# Patient Record
Sex: Male | Born: 1975 | Race: Black or African American | Hispanic: No | Marital: Married | Smoking: Former smoker
Health system: Southern US, Community
[De-identification: ages and names within clinical notes are randomized; demographics above are authoritative.]

## PROBLEM LIST (undated history)

## (undated) DIAGNOSIS — H409 Unspecified glaucoma: Secondary | ICD-10-CM

## (undated) DIAGNOSIS — L309 Dermatitis, unspecified: Secondary | ICD-10-CM

## (undated) HISTORY — DX: Unspecified glaucoma: H40.9

## (undated) HISTORY — PX: ANKLE SURGERY: SHX546

---

## 2005-09-20 ENCOUNTER — Emergency Department (HOSPITAL_COMMUNITY): Admission: EM | Admit: 2005-09-20 | Discharge: 2005-09-20 | Payer: Self-pay | Admitting: Emergency Medicine

## 2006-03-11 ENCOUNTER — Emergency Department (HOSPITAL_COMMUNITY): Admission: EM | Admit: 2006-03-11 | Discharge: 2006-03-11 | Payer: Self-pay | Admitting: Emergency Medicine

## 2007-01-09 ENCOUNTER — Emergency Department (HOSPITAL_COMMUNITY): Admission: EM | Admit: 2007-01-09 | Discharge: 2007-01-09 | Payer: Self-pay | Admitting: Emergency Medicine

## 2007-06-25 ENCOUNTER — Emergency Department (HOSPITAL_COMMUNITY): Admission: EM | Admit: 2007-06-25 | Discharge: 2007-06-25 | Payer: Self-pay | Admitting: Emergency Medicine

## 2007-09-11 ENCOUNTER — Encounter: Payer: Self-pay | Admitting: Internal Medicine

## 2007-10-10 ENCOUNTER — Emergency Department (HOSPITAL_COMMUNITY): Admission: EM | Admit: 2007-10-10 | Discharge: 2007-10-10 | Payer: Self-pay | Admitting: Emergency Medicine

## 2007-12-23 ENCOUNTER — Emergency Department (HOSPITAL_COMMUNITY): Admission: EM | Admit: 2007-12-23 | Discharge: 2007-12-23 | Payer: Self-pay | Admitting: Emergency Medicine

## 2009-09-20 ENCOUNTER — Emergency Department (HOSPITAL_COMMUNITY): Admission: EM | Admit: 2009-09-20 | Discharge: 2009-09-20 | Payer: Self-pay | Admitting: Emergency Medicine

## 2009-10-04 ENCOUNTER — Emergency Department (HOSPITAL_COMMUNITY)
Admission: EM | Admit: 2009-10-04 | Discharge: 2009-10-04 | Payer: Self-pay | Source: Home / Self Care | Admitting: Emergency Medicine

## 2010-06-27 ENCOUNTER — Emergency Department (HOSPITAL_COMMUNITY)
Admission: EM | Admit: 2010-06-27 | Discharge: 2010-06-27 | Payer: Self-pay | Source: Home / Self Care | Admitting: Emergency Medicine

## 2011-09-02 ENCOUNTER — Emergency Department (HOSPITAL_COMMUNITY): Payer: Self-pay

## 2011-09-02 ENCOUNTER — Emergency Department (HOSPITAL_COMMUNITY)
Admission: EM | Admit: 2011-09-02 | Discharge: 2011-09-03 | Disposition: A | Discharge status: Home / Self Care | Payer: Self-pay | Attending: Emergency Medicine | Admitting: Emergency Medicine

## 2011-09-02 ENCOUNTER — Other Ambulatory Visit: Payer: Self-pay

## 2011-09-02 ENCOUNTER — Encounter (HOSPITAL_COMMUNITY): Payer: Self-pay | Admitting: Emergency Medicine

## 2011-09-02 DIAGNOSIS — R05 Cough: Secondary | ICD-10-CM | POA: Insufficient documentation

## 2011-09-02 DIAGNOSIS — J3489 Other specified disorders of nose and nasal sinuses: Secondary | ICD-10-CM | POA: Insufficient documentation

## 2011-09-02 DIAGNOSIS — R059 Cough, unspecified: Secondary | ICD-10-CM | POA: Insufficient documentation

## 2011-09-02 DIAGNOSIS — R079 Chest pain, unspecified: Secondary | ICD-10-CM | POA: Insufficient documentation

## 2011-09-02 DIAGNOSIS — G43909 Migraine, unspecified, not intractable, without status migrainosus: Secondary | ICD-10-CM | POA: Insufficient documentation

## 2011-09-02 DIAGNOSIS — J069 Acute upper respiratory infection, unspecified: Secondary | ICD-10-CM | POA: Insufficient documentation

## 2011-09-02 MED ORDER — IBUPROFEN 800 MG PO TABS
800.0000 mg | ORAL_TABLET | Freq: Once | ORAL | Status: AC
  Administered 2011-09-02: 800 mg via ORAL
  Filled 2011-09-02: qty 1

## 2011-09-02 MED ORDER — SODIUM CHLORIDE 0.9 % IV BOLUS (SEPSIS): 1000.0000 mL | Freq: Once | INTRAVENOUS | Status: DC

## 2011-09-02 MED ORDER — METOCLOPRAMIDE HCL 5 MG/ML IJ SOLN: 10.0000 mg | Freq: Once | INTRAMUSCULAR | Status: DC

## 2011-09-02 MED ORDER — OXYMETAZOLINE HCL 0.05 % NA SOLN
2.0000 | Freq: Once | NASAL | Status: AC
  Administered 2011-09-02: 2 via NASAL
  Filled 2011-09-02: qty 15

## 2011-09-02 MED ORDER — DIPHENHYDRAMINE HCL 50 MG/ML IJ SOLN: 25.0000 mg | Freq: Once | INTRAMUSCULAR | Status: DC

## 2011-09-02 NOTE — ED Notes (Signed)
Pt c/o chest pain that began earlier today and describes the pain as tight with pressure.  Pt denies SOB and states pain does not radiate anywhere else. Pt denies hx of similar symptoms. Pt also c/o headache.

## 2011-09-03 MED ORDER — DIPHENHYDRAMINE HCL 50 MG/ML IJ SOLN
25.0000 mg | Freq: Once | INTRAMUSCULAR | Status: AC
  Administered 2011-09-03: 25 mg via INTRAVENOUS
  Filled 2011-09-03: qty 1

## 2011-09-03 MED ORDER — SODIUM CHLORIDE 0.9 % IV BOLUS (SEPSIS)
1000.0000 mL | Freq: Once | INTRAVENOUS | Status: AC
  Administered 2011-09-03: 1000 mL via INTRAVENOUS

## 2011-09-03 MED ORDER — DEXAMETHASONE SODIUM PHOSPHATE 10 MG/ML IJ SOLN
10.0000 mg | Freq: Once | INTRAMUSCULAR | Status: AC
  Administered 2011-09-03: 10 mg via INTRAVENOUS
  Filled 2011-09-03: qty 1

## 2011-09-03 MED ORDER — METOCLOPRAMIDE HCL 5 MG/ML IJ SOLN
10.0000 mg | Freq: Once | INTRAMUSCULAR | Status: AC
  Administered 2011-09-03: 10 mg via INTRAVENOUS
  Filled 2011-09-03: qty 2

## 2011-09-03 NOTE — Discharge Instructions (Signed)
Headache and Allergies The relationship between allergies and headaches is unclear. Many people with allergic or infectious nasal problems also have headaches (migraines or sinus headaches). However, sometimes allergies can cause pressure that feels like a headache, and sometimes headaches can cause allergy-like symptoms. It is not always clear whether your symptoms are caused by allergies or by a headache. CAUSES   Migraine: The cause of a migraine is not always known.   Sinus Headache: The cause of a sinus headache may be a sinus infection. Other conditions that may be related to sinus headaches include:   Hay fever (allergic rhinitis).   Deviation of the nasal septum.   Swelling or clogging of the nasal passages.  SYMPTOMS  Migraine headache symptoms (which often last 4 to 72 hours) include:  Intense, throbbing pain on one or both sides of the head.   Nausea.   Vomiting.   Being extra sensitive to light.   Being extra sensitive to sound.   Nervous system reactions that appear similar to an allergic reaction:   Stuffy nose.   Runny nose.   Tearing.  Sinus headaches are felt as facial pain or pressure.  DIAGNOSIS  Because there is some overlap in symptoms, sinus and migraine headaches are often misdiagnosed. For example, a person with migraines may also feel facial pressure. Likewise, many people with hay fever may get migraine headaches rather than sinus headaches. These migraines can be triggered by the histamine release during an allergic reaction. An antihistamine medicine can eliminate this pain. There are standard criteria that help clarify the difference between these headaches and related allergy or allergy-like symptoms. Your caregiver can use these criteria to determine the proper diagnosis and provide you the best care. TREATMENT  Migraine medicine may help people who have persistent migraine headaches even though their hay fever is controlled. For some people,  anti-inflammatory treatments do not work to relieve migraines. Medicines called triptans (such as sumatriptan) can be helpful for those people. Document Released: 08/25/2003 Document Revised: 05/24/2011 Document Reviewed: 09/16/2009 [facility] Patient Information 2012 [place], [place].Migraine Headache A migraine headache is an intense, throbbing pain on one or both sides of your head. The exact cause of a migraine headache is not always known. A migraine may be caused when nerves in the brain become irritated and release chemicals that cause swelling within blood vessels, causing pain. Many migraine sufferers have a family history of migraines. Before you get a migraine you may or may not get an aura. An aura is a group of symptoms that can predict the beginning of a migraine. An aura may include:  Visual changes such as:   Flashing lights.   Bright spots or zig-zag lines.   Tunnel vision.   Feelings of numbness.   Trouble talking.   Muscle weakness.  SYMPTOMS  Pain on one or both sides of your head.   Pain that is pulsating or throbbing in nature.   Pain that is severe enough to prevent daily activities.   Pain that is aggravated by any daily physical activity.   Nausea (feeling sick to your stomach), vomiting, or both.   Pain with exposure to bright lights, loud noises, or activity.   General sensitivity to bright lights or loud noises.  MIGRAINE TRIGGERS Examples of triggers of migraine headaches include:   Alcohol.   Smoking.   Stress.   It may be related to menses (male menstruation).   Aged cheeses.   Foods or drinks that contain nitrates, glutamate, aspartame, or  tyramine.   Lack of sleep.   Chocolate.   Caffeine.   Hunger.   Medications such as nitroglycerine (used to treat chest pain), birth control pills, estrogen, and some blood pressure medications.  DIAGNOSIS  A migraine headache is often diagnosed based on:  Symptoms.   Physical examination.    A computerized X-ray scan (computed tomography, CT) of your head.  TREATMENT  Medications can help prevent migraines if they are recurrent or should they become recurrent. Your caregiver can help you with a medication or treatment program that will be helpful to you.   Lying down in a dark, quiet room may be helpful.   Keeping a headache diary may help you find a trend as to what may be triggering your headaches.  SEEK IMMEDIATE MEDICAL CARE IF:   You have confusion, personality changes or seizures.   You have headaches that wake you from sleep.   You have an increased frequency in your headaches.   You have a stiff neck.   You have a loss of vision.   You have muscle weakness.   You start losing your balance or have trouble walking.   You feel faint or pass out.  MAKE SURE YOU:   Understand these instructions.   Will watch your condition.   Will get help right away if you are not doing well or get worse.  Document Released: 06/04/2005 Document Revised: 05/24/2011 Document Reviewed: 01/18/2009 [facility] Patient Information 2012 [place], [place].Upper Respiratory Infection, Adult An upper respiratory infection (URI) is also sometimes known as the common cold. The upper respiratory tract includes the nose, sinuses, throat, trachea, and bronchi. Bronchi are the airways leading to the lungs. Most people improve within 1 week, but symptoms can last up to 2 weeks. A residual cough may last even longer.  CAUSES Many different viruses can infect the tissues lining the upper respiratory tract. The tissues become irritated and inflamed and often become very moist. Mucus production is also common. A cold is contagious. You can easily spread the virus to others by oral contact. This includes kissing, sharing a glass, coughing, or sneezing. Touching your mouth or nose and then touching a surface, which is then touched by another person, can also spread the virus. SYMPTOMS  Symptoms  typically develop 1 to 3 days after you come in contact with a cold virus. Symptoms vary from person to person. They may include:  Runny nose.   Sneezing.   Nasal congestion.   Sinus irritation.   Sore throat.   Loss of voice (laryngitis).   Cough.   Fatigue.   Muscle aches.   Loss of appetite.   Headache.   Low-grade fever.  DIAGNOSIS  You might diagnose your own cold based on familiar symptoms, since most people get a cold 2 to 3 times a year. Your caregiver can confirm this based on your exam. Most importantly, your caregiver can check that your symptoms are not due to another disease such as strep throat, sinusitis, pneumonia, asthma, or epiglottitis. Blood tests, throat tests, and X-rays are not necessary to diagnose a common cold, but they may sometimes be helpful in excluding other more serious diseases. Your caregiver will decide if any further tests are required. RISKS AND COMPLICATIONS  You may be at risk for a more severe case of the common cold if you smoke cigarettes, have chronic heart disease (such as heart failure) or lung disease (such as asthma), or if you have a weakened immune system. The very  young and very old are also at risk for more serious infections. Bacterial sinusitis, middle ear infections, and bacterial pneumonia can complicate the common cold. The common cold can worsen asthma and chronic obstructive pulmonary disease (COPD). Sometimes, these complications can require emergency medical care and may be life-threatening. PREVENTION  The best way to protect against getting a cold is to practice good hygiene. Avoid oral or hand contact with people with cold symptoms. Wash your hands often if contact occurs. There is no clear evidence that vitamin C, vitamin E, echinacea, or exercise reduces the chance of developing a cold. However, it is always recommended to get plenty of rest and practice good nutrition. TREATMENT  Treatment is directed at relieving  symptoms. There is no cure. Antibiotics are not effective, because the infection is caused by a virus, not by bacteria. Treatment may include:  Increased fluid intake. Sports drinks offer valuable electrolytes, sugars, and fluids.   Breathing heated mist or steam (vaporizer or shower).   Eating chicken soup or other clear broths, and maintaining good nutrition.   Getting plenty of rest.   Using gargles or lozenges for comfort.   Controlling fevers with ibuprofen or acetaminophen as directed by your caregiver.   Increasing usage of your inhaler if you have asthma.  Zinc gel and zinc lozenges, taken in the first 24 hours of the common cold, can shorten the duration and lessen the severity of symptoms. Pain medicines may help with fever, muscle aches, and throat pain. A variety of non-prescription medicines are available to treat congestion and runny nose. Your caregiver can make recommendations and may suggest nasal or lung inhalers for other symptoms.  HOME CARE INSTRUCTIONS   Only take over-the-counter or prescription medicines for pain, discomfort, or fever as directed by your caregiver.   Use a warm mist humidifier or inhale steam from a shower to increase air moisture. This may keep secretions moist and make it easier to breathe.   Drink enough water and fluids to keep your urine clear or pale yellow.   Rest as needed.   Return to work when your temperature has returned to normal or as your caregiver advises. You may need to stay home longer to avoid infecting others. You can also use a face mask and careful hand washing to prevent spread of the virus.  SEEK MEDICAL CARE IF:   After the first few days, you feel you are getting worse rather than better.   You need your caregiver's advice about medicines to control symptoms.   You develop chills, worsening shortness of breath, or brown or red sputum. These may be signs of pneumonia.   You develop yellow or brown nasal discharge or  pain in the face, especially when you bend forward. These may be signs of sinusitis.   You develop a fever, swollen neck glands, pain with swallowing, or white areas in the back of your throat. These may be signs of strep throat.  SEEK IMMEDIATE MEDICAL CARE IF:   You have a fever.   You develop severe or persistent headache, ear pain, sinus pain, or chest pain.   You develop wheezing, a prolonged cough, cough up blood, or have a change in your usual mucus (if you have chronic lung disease).   You develop sore muscles or a stiff neck.  Document Released: 11/28/2000 Document Revised: 05/24/2011 Document Reviewed: 10/06/2010 [facility] Patient Information 2012 [place], [place].

## 2011-09-03 NOTE — ED Provider Notes (Signed)
History     CSN: [id number]  Arrival date & time 09/02/11  2130   First MD Initiated Contact with Patient 09/02/11 2258      Chief Complaint  Patient presents with  . Chest Pain    (Consider location/radiation/quality/duration/timing/severity/associated sxs/prior treatment) HPI Patient is a 36 yo M who presents complaining of 8/10 chest pain as well as upper airway congestion and cough.  He has no radiation of his pain and reports it is worse with coughing.  He also complains of 10/10 pain with a headache.  Patient has no fevers, nuchal rigidity, neck pain, neurologic symptoms or rahs.  He has no early family history of CAD or personal or family history of PE or DVT.  Patient does not had HTN, HLD, or DM.  He has had no improvement with OTC meds.  There are no other associated or modifying factors.  History reviewed. No pertinent past medical history.  History reviewed. No pertinent past surgical history.  History reviewed. No pertinent family history.  History  Substance Use Topics  . Smoking status: Not on file  . Smokeless tobacco: Not on file  . Alcohol Use: Not on file      Review of Systems  Constitutional: Positive for fatigue.  HENT: Positive for congestion.   Eyes: Positive for photophobia.  Respiratory: Positive for cough.   Cardiovascular: Positive for chest pain.  Gastrointestinal: Negative.   Genitourinary: Negative.   Musculoskeletal: Negative.   Skin: Negative.   Neurological: Positive for headaches.  Hematological: Negative.   Psychiatric/Behavioral: Negative.   All other systems reviewed and are negative.    Allergies  Review of patient's allergies indicates no known allergies.  Home Medications   Current Outpatient Rx  Name Route Sig Dispense Refill  . CETIRIZINE HCL 10 MG PO TABS Oral Take 10 mg by mouth daily.    . TETRAHYDROZOLINE HCL 0.05 % OP SOLN Both Eyes Place 1 drop into both eyes 4 (four) times daily as needed. For dry/irritated  eyes      BP 142/82  Pulse 64  Resp 16  SpO2 100%  Physical Exam  Nursing note and vitals reviewed. GEN: Well-developed, well-nourished male in no distress HEENT: Atraumatic, normocephalic. Oropharynx clear without erythema EYES: PERRLA BL, no scleral icterus. NECK: Trachea midline, no meningismus CV: regular rate and rhythm. No murmurs, rubs, or gallops PULM: No respiratory distress.  No crackles, wheezes, or rales. GI: soft, non-tender. No guarding, rebound, or tenderness. + bowel sounds  GU: deferred Neuro: cranial nerves 2-12 intact, no abnormalities of strength or sensation, A and O x 3 MSK: Patient moves all 4 extremities symmetrically, no deformity, edema, or injury noted Skin: No rashes petechiae, purpura, or jaundice Psych: no abnormality of mood   ED Course  Procedures (including critical care time)   Date: 09/03/2011  Rate: 66  Rhythm: normal sinus rhythm  QRS Axis: normal  Intervals: normal  ST/T Wave abnormalities: normal  Conduction Disutrbances: none  Narrative Interpretation: unremarkable    Labs Reviewed - No data to display Dg Chest 2 View  09/02/2011  *RADIOLOGY REPORT*  Clinical Data: Chest pain for 24 hours  CHEST - 2 VIEW  Comparison: 06/25/2007  Findings: The heart size and mediastinal contours are within normal limits.  Both lungs are clear.  The visualized skeletal structures are unremarkable.  IMPRESSION:  Negative exam.  Original Report Authenticated By: Rosealee Albee, M.D.     1. Acute URI   2. Migraine  MDM  Patient had CXR and EKG that were unremarkable and given history required no further work-up for chest pain.  His headache was treated initially with Afrin for congestion with minimal improvement.  He was then given reglan and benadryl with complete resolution of symptoms.  Patient was given decadron and discharged in good condition.         Cyndra Numbers, MD 09/05/11 [phone]

## 2012-02-13 ENCOUNTER — Emergency Department (HOSPITAL_COMMUNITY)
Admission: EM | Admit: 2012-02-13 | Discharge: 2012-02-13 | Disposition: A | Discharge status: Home / Self Care | Payer: Self-pay | Attending: Emergency Medicine | Admitting: Emergency Medicine

## 2012-02-13 ENCOUNTER — Emergency Department (HOSPITAL_COMMUNITY): Payer: Self-pay

## 2012-02-13 ENCOUNTER — Encounter (HOSPITAL_COMMUNITY): Payer: Self-pay | Admitting: Emergency Medicine

## 2012-02-13 DIAGNOSIS — R05 Cough: Secondary | ICD-10-CM

## 2012-02-13 DIAGNOSIS — F17200 Nicotine dependence, unspecified, uncomplicated: Secondary | ICD-10-CM | POA: Insufficient documentation

## 2012-02-13 DIAGNOSIS — J069 Acute upper respiratory infection, unspecified: Secondary | ICD-10-CM | POA: Insufficient documentation

## 2012-02-13 LAB — CBC
Hemoglobin: 14.2 g/dL (ref 13.0–17.0)
MCH: 28.7 pg (ref 26.0–34.0)
RBC: 4.95 MIL/uL (ref 4.22–5.81)

## 2012-02-13 NOTE — ED Notes (Signed)
Pt states he started coughing up blood on Tuesday and has had some dizziness and a headache pressure  Pt states every now and then he will have pain in his chest

## 2012-02-13 NOTE — ED Provider Notes (Signed)
History     CSN: [id number]  Arrival date & time 02/13/12  0034   First MD Initiated Contact with Patient 02/13/12 0143      Chief Complaint  Patient presents with  . Hemoptysis    (Consider location/radiation/quality/duration/timing/severity/associated sxs/prior treatment) HPI Comments: Patient states, that it for the past 2, days.  He's had some nasal congestion, and pressure in his frontal area.  It is worse when he bends over.  He occasionally feels dizzy.  Tonight at work.  He had a coughing episode, and saw one episode of blood-tinged mucus on the tissue.  He was sent for further evaluation by his employer.  He, states he's never had any episodes like this before.  He is a smoker.  He denies sore throat, or any over-the-counter medication use  The history is provided by the patient.    History reviewed. No pertinent past medical history.  History reviewed. No pertinent past surgical history.  Family History  Problem Relation Age of Onset  . Diabetes Father   . Hypertension Other     History  Substance Use Topics  . Smoking status: Current Everyday Smoker    Types: Cigarettes  . Smokeless tobacco: Not on file  . Alcohol Use: No      Review of Systems  Constitutional: Negative for fever and chills.  HENT: Positive for congestion. Negative for sore throat, rhinorrhea and ear discharge.   Respiratory: Positive for cough. Negative for shortness of breath and wheezing.   Gastrointestinal: Negative for nausea and vomiting.  Musculoskeletal: Negative for myalgias.  Skin: Negative for rash and wound.  Neurological: Positive for dizziness. Negative for headaches.    Allergies  Review of patient's allergies indicates no known allergies.  Home Medications  No current outpatient prescriptions on file.  BP 134/68  Pulse 65  Temp 97.8 F (36.6 C) (Oral)  Resp 24  SpO2 100%  Physical Exam  Constitutional: He is oriented to person, place, and time. He appears  well-nourished.  HENT:  Head: Normocephalic and atraumatic.  Mouth/Throat: Uvula is midline and mucous membranes are normal. No oropharyngeal exudate, posterior oropharyngeal edema or posterior oropharyngeal erythema.  Neck: Normal range of motion.  Cardiovascular: Normal rate.   Pulmonary/Chest: Effort normal and breath sounds normal. No respiratory distress. He has no wheezes. He has no rales. He exhibits no tenderness.  Abdominal: Soft. He exhibits no distension.  Musculoskeletal: Normal range of motion.  Neurological: He is alert and oriented to person, place, and time.  Skin: Skin is warm.    ED Course  Procedures (including critical care time)   Labs Reviewed  CBC   Dg Chest 2 View  02/13/2012  *RADIOLOGY REPORT*  Clinical Data: Hemoptysis  CHEST - 2 VIEW  Comparison: 09/02/2011  Findings: Lungs are clear. No pleural effusion or pneumothorax. The cardiomediastinal contours are within normal limits. The visualized bones and soft tissues are without significant appreciable abnormality.  IMPRESSION: No radiographic evidence of acute cardiopulmonary process.   Original Report Authenticated By: Waneta Martins, M.D.      1. URI (upper respiratory infection)   2. Cough       MDM   Chest x-ray, reviewed and is, normal.  We'll obtain CBC, mostly for patient's reassurance        Arman Filter, NP 02/13/12 [phone]

## 2012-02-13 NOTE — ED Provider Notes (Signed)
Medical screening examination/treatment/procedure(s) were performed by non-physician practitioner and as supervising physician I was immediately available for consultation/collaboration.   Lyanne Co, MD 02/13/12 [phone]

## 2012-09-17 ENCOUNTER — Emergency Department (HOSPITAL_COMMUNITY): Payer: BC Managed Care – PPO

## 2012-09-17 ENCOUNTER — Encounter (HOSPITAL_COMMUNITY): Payer: Self-pay | Admitting: Emergency Medicine

## 2012-09-17 ENCOUNTER — Emergency Department (HOSPITAL_COMMUNITY)
Admission: EM | Admit: 2012-09-17 | Discharge: 2012-09-18 | Disposition: A | Discharge status: Home / Self Care | Payer: BC Managed Care – PPO | Attending: Emergency Medicine | Admitting: Emergency Medicine

## 2012-09-17 DIAGNOSIS — R0789 Other chest pain: Secondary | ICD-10-CM

## 2012-09-17 DIAGNOSIS — F17200 Nicotine dependence, unspecified, uncomplicated: Secondary | ICD-10-CM | POA: Insufficient documentation

## 2012-09-17 DIAGNOSIS — R071 Chest pain on breathing: Secondary | ICD-10-CM | POA: Insufficient documentation

## 2012-09-17 DIAGNOSIS — R5381 Other malaise: Secondary | ICD-10-CM | POA: Insufficient documentation

## 2012-09-17 DIAGNOSIS — R0602 Shortness of breath: Secondary | ICD-10-CM | POA: Insufficient documentation

## 2012-09-17 LAB — POCT I-STAT, CHEM 8
Glucose, Bld: 89 mg/dL (ref 70–99)
HCT: 46 % (ref 39.0–52.0)
Potassium: 4.2 mEq/L (ref 3.5–5.1)
TCO2: 23 mmol/L (ref 0–100)

## 2012-09-17 NOTE — ED Provider Notes (Signed)
History     CSN: [id number]  Arrival date & time 09/17/12  1839   First MD Initiated Contact with Patient 09/17/12 2028      Chief Complaint  Patient presents with  . Chest Pain    (Consider location/radiation/quality/duration/timing/severity/associated sxs/prior treatment) HPI Comments: Anthony Carroll is a 37 y.o. Male who suddenly developed chest pain at 4 PM today, that lasted 15 minutes. The pain was 7/10. It felt "like a pressure". Since then. He has had intermittent chest pain several times for a few minutes. He has mild shortness of breath and weakness, when he is having the chest pain. He has never had this previously. He denies nausea, vomiting, diaphoresis, cough, abdominal or back pain. He did not take anything for the problem. There are no known modifying factors  Patient is a 37 y.o. male presenting with chest pain. The history is provided by the patient.  Chest Pain   History reviewed. No pertinent past medical history.  History reviewed. No pertinent past surgical history.  Family History  Problem Relation Age of Onset  . Diabetes Father   . Hypertension Other     History  Substance Use Topics  . Smoking status: Current Every Day Smoker    Types: Cigarettes  . Smokeless tobacco: Not on file  . Alcohol Use: No      Review of Systems  Cardiovascular: Positive for chest pain.  All other systems reviewed and are negative.    Allergies  Review of patient's allergies indicates no known allergies.  Home Medications   Current Outpatient Rx  Name  Route  Sig  Dispense  Refill  . Multiple Vitamin (MULTIVITAMIN WITH MINERALS) TABS   Oral   Take 1 tablet by mouth daily.         Marland Kitchen OVER THE COUNTER MEDICATION   Oral   Take 1 tablet by mouth daily. Hydroxycut.           BP 136/92  Pulse 64  Temp(Src) 98.1 F (36.7 C) (Oral)  Resp 14  SpO2 98%  Physical Exam  Nursing note and vitals reviewed. Constitutional: He is oriented to person, place, and  time. He appears well-developed and well-nourished.  HENT:  Head: Normocephalic and atraumatic.  Right Ear: External ear normal.  Left Ear: External ear normal.  Eyes: Conjunctivae and EOM are normal. Pupils are equal, round, and reactive to light.  Neck: Normal range of motion and phonation normal. Neck supple.  Cardiovascular: Normal rate, regular rhythm, normal heart sounds and intact distal pulses.   Pulmonary/Chest: Effort normal and breath sounds normal. He exhibits tenderness (mild left anterior lower chest wall tenderness). He exhibits no bony tenderness.  Abdominal: Soft. Normal appearance. There is no tenderness.  Musculoskeletal: Normal range of motion.  Neurological: He is alert and oriented to person, place, and time. He has normal strength. No cranial nerve deficit or sensory deficit. He exhibits normal muscle tone. Coordination normal.  Skin: Skin is warm, dry and intact. No rash noted. No pallor.  Psychiatric: He has a normal mood and affect. His behavior is normal. Judgment and thought content normal.    ED Course  Procedures (including critical care time)  Reevaluation at discharge: No  Chest pain  Filed Vitals:   09/17/12 2230 09/17/12 2300 09/17/12 2304 09/17/12 2330  BP: 131/77 137/89 137/89 136/92  Pulse: 58 65 63 64  Temp:   98.1 F (36.7 C)   TempSrc:   Oral   Resp: 16 18 18  14  SpO2: 100% 99% 99% 98%      Date: 04/04/2012  Rate: 76  Rhythm: normal sinus rhythm  QRS Axis: normal  PR and QT Intervals: normal  ST/T Wave abnormalities: normal  PR and QRS Conduction Disutrbances:none  Narrative Interpretation:   Old EKG Reviewed: unchanged- 02/13/12    Nursing Notes Reviewed/ Care Coordinated, and agree without changes. Applicable Imaging Reviewed Interpretation of Laboratory Data incorporated into ED treatment    1. Chest wall pain       MDM  Nonspecific chest pain, atypical for cardiac disease, with a component of chest wall discomfort  that is reproducible, and similar to the pain he has been having. Doubt any CNS, PE, or pneumonia. Doubt metabolic instability, serious bacterial infection or impending vascular collapse; the patient is stable for discharge.    Plan: Home Medications- Tylenol prn; Home Treatments- rest; Recommended follow up- PCP prn       Flint Melter, MD 09/18/12 [phone]

## 2012-09-17 NOTE — ED Notes (Signed)
Pt was sitting watching tv and began to have epigastric pain some sob, constant pain, denies any heart hx, calm

## 2013-09-22 ENCOUNTER — Emergency Department (HOSPITAL_COMMUNITY)
Admission: EM | Admit: 2013-09-22 | Discharge: 2013-09-22 | Disposition: A | Discharge status: Home / Self Care | Payer: Worker's Compensation | Attending: Emergency Medicine | Admitting: Emergency Medicine

## 2013-09-22 ENCOUNTER — Emergency Department (HOSPITAL_COMMUNITY): Payer: Worker's Compensation

## 2013-09-22 ENCOUNTER — Encounter (HOSPITAL_COMMUNITY): Payer: Self-pay | Admitting: Emergency Medicine

## 2013-09-22 DIAGNOSIS — Y939 Activity, unspecified: Secondary | ICD-10-CM | POA: Insufficient documentation

## 2013-09-22 DIAGNOSIS — W230XXA Caught, crushed, jammed, or pinched between moving objects, initial encounter: Secondary | ICD-10-CM | POA: Diagnosis not present

## 2013-09-22 DIAGNOSIS — IMO0002 Reserved for concepts with insufficient information to code with codable children: Secondary | ICD-10-CM | POA: Insufficient documentation

## 2013-09-22 DIAGNOSIS — S6980XA Other specified injuries of unspecified wrist, hand and finger(s), initial encounter: Secondary | ICD-10-CM | POA: Insufficient documentation

## 2013-09-22 DIAGNOSIS — Z79899 Other long term (current) drug therapy: Secondary | ICD-10-CM | POA: Insufficient documentation

## 2013-09-22 DIAGNOSIS — F17200 Nicotine dependence, unspecified, uncomplicated: Secondary | ICD-10-CM | POA: Diagnosis not present

## 2013-09-22 DIAGNOSIS — L03019 Cellulitis of unspecified finger: Secondary | ICD-10-CM | POA: Insufficient documentation

## 2013-09-22 DIAGNOSIS — S6990XA Unspecified injury of unspecified wrist, hand and finger(s), initial encounter: Principal | ICD-10-CM | POA: Insufficient documentation

## 2013-09-22 DIAGNOSIS — Y929 Unspecified place or not applicable: Secondary | ICD-10-CM | POA: Insufficient documentation

## 2013-09-22 MED ORDER — HYDROCODONE-ACETAMINOPHEN 5-325 MG PO TABS: 1.0000 | ORAL_TABLET | ORAL | Status: DC | PRN

## 2013-09-22 MED ORDER — CLINDAMYCIN HCL 300 MG PO CAPS: 300.0000 mg | ORAL_CAPSULE | Freq: Four times a day (QID) | ORAL | Status: DC

## 2013-09-22 NOTE — Discharge Instructions (Signed)
Paronychia Paronychia is an inflammatory reaction involving the folds of the skin surrounding the fingernail. This is commonly caused by an infection in the skin around a nail. The most common cause of paronychia is frequent wetting of the hands (as seen with bartenders, food servers, nurses or others who wet their hands). This makes the skin around the fingernail susceptible to infection by bacteria (germs) or fungus. Other predisposing factors are:  Aggressive manicuring.  Nail biting.  Thumb sucking. The most common cause is a staphylococcal (a type of germ) infection, or a fungal (Candida) infection. When caused by a germ, it usually comes on suddenly with redness, swelling, pus and is often painful. It may get under the nail and form an abscess (collection of pus), or form an abscess around the nail. If the nail itself is infected with a fungus, the treatment is usually prolonged and may require oral medicine for up to one year. Your caregiver will determine the length of time treatment is required. The paronychia caused by bacteria (germs) may largely be avoided by not pulling on hangnails or picking at cuticles. When the infection occurs at the tips of the finger it is called felon. When the cause of paronychia is from the herpes simplex virus (HSV) it is called herpetic whitlow. TREATMENT  When an abscess is present treatment is often incision and drainage. This means that the abscess must be cut open so the pus can get out. When this is done, the following home care instructions should be followed. HOME CARE INSTRUCTIONS   It is important to keep the affected fingers very dry. Rubber or plastic gloves over cotton gloves should be used whenever the hand must be placed in water.  Keep wound clean, dry and dressed as suggested by your caregiver between warm soaks or warm compresses.  Soak in warm water for fifteen to twenty minutes three to four times per day for bacterial infections. Fungal  infections are very difficult to treat, so often require treatment for long periods of time.  For bacterial (germ) infections take antibiotics (medicine which kill germs) as directed and finish the prescription, even if the problem appears to be solved before the medicine is gone.  Only take over-the-counter or prescription medicines for pain, discomfort, or fever as directed by your caregiver. SEEK IMMEDIATE MEDICAL CARE IF:  You have redness, swelling, or increasing pain in the wound.  You notice pus coming from the wound.  You have a fever.  You notice a bad smell coming from the wound or dressing. Document Released: 11/28/2000 Document Revised: 08/27/2011 Document Reviewed: 07/30/2008 ExitCare Patient Information 2014 [employer].  

## 2013-09-22 NOTE — ED Notes (Signed)
Pt reports around 0300 he shut a door on his middle finger left hand. Swelling noted. Unable to bend distal joint. Denies numbness or tingling. Reports throbbing pain 10/10. bil radial pulses strong.

## 2013-09-22 NOTE — ED Provider Notes (Signed)
CSN: [id number]     Arrival date & time 09/22/13  [id number] History  First MD Initiated Contact with Patient 09/22/13 [id number]     Chief Complaint  Patient presents with  . middle finger injury left hand     The history is provided by the patient.   patient states on Friday he closed a door on his left middle finger. He injured the tip of his finger. Patient did not think that much of it initially. This morning he noticed swelling and increased pain. He has difficulty bending his finger without discomfort. The pain is throbbing and at times severe. He denies any numbness or tingling  History reviewed. No pertinent past medical history. Past Surgical History  Procedure Laterality Date  . Ankle surgery      left 1999   Family History  Problem Relation Age of Onset  . Diabetes Father   . Hypertension Other    History  Substance Use Topics  . Smoking status: Current Every Day Smoker -- 1.00 packs/day for 6 years    Types: Cigarettes  . Smokeless tobacco: Not on file  . Alcohol Use: No    Review of Systems  Constitutional: Negative for fever.  Gastrointestinal: Negative for vomiting.  Skin: Positive for wound. Negative for rash.      Allergies  Review of patient's allergies indicates no known allergies.  Home Medications   Current Outpatient Rx  Name  Route  Sig  Dispense  Refill  . cetirizine (ZYRTEC) 10 MG tablet   Oral   Take 10 mg by mouth daily.         . Hydrocortisone (CORTIZONE-10 ECZEMA EX)   Apply externally   Apply 1 application topically daily.         Marland Kitchen. tetrahydrozoline (VISINE) 0.05 % ophthalmic solution   Both Eyes   Place 2 drops into both eyes daily at 12 noon.         . clindamycin (CLEOCIN) 300 MG capsule   Oral   Take 1 capsule (300 mg total) by mouth every 6 (six) hours.   21 capsule   0   . HYDROcodone-acetaminophen (NORCO/VICODIN) 5-325 MG per tablet   Oral   Take 1-2 tablets by mouth every 4 (four) hours as needed.   16 tablet   0     BP 147/84  Pulse 71  Temp(Src) 98.4 F (36.9 C) (Oral)  Resp 16  SpO2 98% Physical Exam  Nursing note and vitals reviewed. Constitutional: He appears well-developed and well-nourished. No distress.  HENT:  Head: Normocephalic and atraumatic.  Right Ear: External ear normal.  Left Ear: External ear normal.  Eyes: Conjunctivae are normal. Right eye exhibits no discharge. Left eye exhibits no discharge. No scleral icterus.  Neck: Neck supple. No tracheal deviation present.  Cardiovascular: Normal rate.   Pulmonary/Chest: Effort normal. No stridor. No respiratory distress.  Musculoskeletal: He exhibits no edema.       Left hand: He exhibits tenderness and swelling. Normal sensation noted. Normal strength noted.       Hands: Neurological: He is alert. Cranial nerve deficit: no gross deficits.  Skin: Skin is warm and dry. No rash noted.  Psychiatric: He has a normal mood and affect.    ED Course  INCISION AND DRAINAGE Date/Time: 09/22/2013 8:53 AM Performed by: Linwood DibblesKNAPP, Pollyann Roa R Authorized by: Linwood DibblesKNAPP, Shadia Larose R Consent: Verbal consent obtained. Type: abscess Body area: upper extremity Location details: left long finger Anesthesia: digital block Local anesthetic: lidocaine 1% without  epinephrine Anesthetic total: 4 ml Patient sedated: no Scalpel size: 11 Incision type: single straight Complexity: simple Drainage: purulent and  serosanguinous Drainage amount: moderate Wound treatment: wound left open Patient tolerance: Patient tolerated the procedure well with no immediate complications.   Imaging Review Dg Finger Middle Left  09/22/2013   CLINICAL DATA:  Showed door on finger.  Pain distal and.  EXAM: LEFT MIDDLE FINGER 2+V  COMPARISON:  None.  FINDINGS: The DIP joint is slightly flexed on the AP and oblique views. The joints are aligned. No acute fracture is identified. Soft tissues of the distal finger appear prominent. No soft tissue gas or radiopaque foreign body is identified.   IMPRESSION: Soft tissue swelling of the distal left third digit. No acute fracture identified.   Electronically Signed   By: Britta Mccreedy M.D.   On: 09/22/2013 08:32      MDM   Final diagnoses:  Paronychia    No fracture.  Suspect soft tissue injury resulted in a paronychia.  Doubt felon.  Will rx abx considering the surrounding edema, and erythema to the finger pad.  F/u PCP    Celene Kras, MD 09/22/13 [phone]

## 2013-09-22 NOTE — Progress Notes (Signed)
[facility] did not get to see patient but will be sending information about the [facility] [employer] application, using the address provided.  °

## 2015-05-12 ENCOUNTER — Emergency Department (HOSPITAL_COMMUNITY)
Admission: EM | Admit: 2015-05-12 | Discharge: 2015-05-12 | Disposition: A | Discharge status: Home / Self Care | Payer: BLUE CROSS/BLUE SHIELD | Attending: Emergency Medicine | Admitting: Emergency Medicine

## 2015-05-12 ENCOUNTER — Encounter (HOSPITAL_COMMUNITY): Payer: Self-pay | Admitting: Emergency Medicine

## 2015-05-12 DIAGNOSIS — Z792 Long term (current) use of antibiotics: Secondary | ICD-10-CM | POA: Diagnosis not present

## 2015-05-12 DIAGNOSIS — Z23 Encounter for immunization: Secondary | ICD-10-CM | POA: Insufficient documentation

## 2015-05-12 DIAGNOSIS — S81812A Laceration without foreign body, left lower leg, initial encounter: Secondary | ICD-10-CM | POA: Insufficient documentation

## 2015-05-12 DIAGNOSIS — Z79899 Other long term (current) drug therapy: Secondary | ICD-10-CM | POA: Diagnosis not present

## 2015-05-12 DIAGNOSIS — Y9289 Other specified places as the place of occurrence of the external cause: Secondary | ICD-10-CM | POA: Diagnosis not present

## 2015-05-12 DIAGNOSIS — IMO0002 Reserved for concepts with insufficient information to code with codable children: Secondary | ICD-10-CM

## 2015-05-12 DIAGNOSIS — Y9389 Activity, other specified: Secondary | ICD-10-CM | POA: Diagnosis not present

## 2015-05-12 DIAGNOSIS — F17210 Nicotine dependence, cigarettes, uncomplicated: Secondary | ICD-10-CM | POA: Diagnosis not present

## 2015-05-12 DIAGNOSIS — Z7952 Long term (current) use of systemic steroids: Secondary | ICD-10-CM | POA: Diagnosis not present

## 2015-05-12 DIAGNOSIS — Y288XXA Contact with other sharp object, undetermined intent, initial encounter: Secondary | ICD-10-CM | POA: Insufficient documentation

## 2015-05-12 DIAGNOSIS — Y998 Other external cause status: Secondary | ICD-10-CM | POA: Insufficient documentation

## 2015-05-12 MED ORDER — SODIUM CHLORIDE 0.9 % IV BOLUS (SEPSIS): 1000.0000 mL | Freq: Once | INTRAVENOUS | Status: DC

## 2015-05-12 MED ORDER — KETOROLAC TROMETHAMINE 30 MG/ML IJ SOLN: 30.0000 mg | Freq: Once | INTRAMUSCULAR | Status: DC

## 2015-05-12 MED ORDER — TETANUS-DIPHTH-ACELL PERTUSSIS 5-2.5-18.5 LF-MCG/0.5 IM SUSP
0.5000 mL | Freq: Once | INTRAMUSCULAR | Status: AC
  Administered 2015-05-12: 0.5 mL via INTRAMUSCULAR
  Filled 2015-05-12: qty 0.5

## 2015-05-12 MED ORDER — LIDOCAINE-EPINEPHRINE 2 %-1:100000 IJ SOLN
20.0000 mL | Freq: Once | INTRAMUSCULAR | Status: AC
  Administered 2015-05-12: 20 mL
  Filled 2015-05-12: qty 1

## 2015-05-12 NOTE — Discharge Instructions (Signed)

## 2015-05-12 NOTE — ED Notes (Signed)
Pt has deep 2 cm laceration with minimal bleeding to left medial lower leg from metal piece of truck. No tetanus shot in past 10 years.

## 2015-05-12 NOTE — ED Provider Notes (Signed)
CSN: [id number]     Arrival date & time 05/12/15  1052 History   First MD Initiated Contact with Patient 05/12/15 1053     Chief Complaint  Patient presents with  . Extremity Laceration     (Consider location/radiation/quality/duration/timing/severity/associated sxs/prior Treatment) HPI  Anthony Carroll is a 39 y.o. male  PCP: No primary care provider on file.  Blood pressure 151/95, pulse 83, temperature 98 F (36.7 C), temperature source Oral, resp. rate 17, SpO2 95 %.  SIGNIFICANT PMH: ankle surgery CHIEF COMPLAINT: laceration  When: 1 hour prior to arrival Mechanism: cut by metal on truck Chronicity: acute Location: left lower leg Radiation: none Quality and severity: moderate Treatments tried: band aid Alleviating factors: resting Worsening factors: touching the wound Associated Symptoms: mild amount of bleeding Risk Factors: none  Negative ROS: Confusion, diaphoresis, fever, headache, weakness (general or focal), change of vision,  neck pain, dysphagia, aphagia, chest pain, shortness of breath,  back pain, abdominal pains, nausea, vomiting, diarrhea, lower extremity swelling, rash.     History reviewed. No pertinent past medical history. Past Surgical History  Procedure Laterality Date  . Ankle surgery      left 1999   Family History  Problem Relation Age of Onset  . Diabetes Father   . Hypertension Other    Social History  Substance Use Topics  . Smoking status: Current Every Day Smoker -- 1.00 packs/day for 6 years    Types: Cigarettes  . Smokeless tobacco: None  . Alcohol Use: No    Review of Systems  ROS: See HPI Constitutional: no fever  Eyes: no drainage  ENT: no runny nose  Cardiovascular: no chest pain  Resp: no SOB  GI: no vomiting GU: no dysuria Integumentary: no rash  Allergy: no hives  Musculoskeletal: no leg swelling  Neurological: no slurred speech Skin: + laceration ROS otherwise negative   Allergies  Review of  patient's allergies indicates no known allergies.  Home Medications   Prior to Admission medications   Medication Sig Start Date End Date Taking? Authorizing Provider  cetirizine (ZYRTEC) 10 MG tablet Take 10 mg by mouth daily.    Historical Provider, MD  clindamycin (CLEOCIN) 300 MG capsule Take 1 capsule (300 mg total) by mouth every 6 (six) hours. 09/22/13   Linwood DibblesJon Knapp, MD  HYDROcodone-acetaminophen (NORCO/VICODIN) 5-325 MG per tablet Take 1-2 tablets by mouth every 4 (four) hours as needed. 09/22/13   Linwood DibblesJon Knapp, MD  Hydrocortisone (CORTIZONE-10 ECZEMA EX) Apply 1 application topically daily.    Historical Provider, MD  tetrahydrozoline (VISINE) 0.05 % ophthalmic solution Place 2 drops into both eyes daily at 12 noon.    Historical Provider, MD   BP 151/95 mmHg  Pulse 83  Temp(Src) 98 F (36.7 C) (Oral)  Resp 17  SpO2 95% Physical Exam  Constitutional: He appears well-developed and well-nourished. No distress.  HENT:  Head: Normocephalic and atraumatic.  Eyes: Pupils are equal, round, and reactive to light.  Neck: Normal range of motion. Neck supple.  Cardiovascular: Normal rate and regular rhythm.   Pulmonary/Chest: Effort normal.  Abdominal: Soft.  Neurological: He is alert.  Skin: Skin is warm and dry.  2.5 cm linear laceration to the left lower extremity anteromedial side. It extends into the subcutaneous tissue. Pedal pulses symmetrical, FROM of ankle and all 5 toes.  Nursing note and vitals reviewed.   ED Course  Procedures (including critical care time) Labs Review Labs Reviewed - No data to display  Imaging Review No results  found. I have personally reviewed and evaluated these images and lab results as part of my medical decision-making.   EKG Interpretation None      MDM   Final diagnoses:  Laceration    LACERATION REPAIR Performed by: Dorthula Matas Authorized by: Dorthula Matas Consent: Verbal consent obtained. Risks and benefits: risks, benefits  and alternatives were discussed Consent given by: patient Patient identity confirmed: provided demographic data Prepped and Draped in normal sterile fashion Wound explored  Laceration Location: right leg  Laceration Length: 2.5 cm  No Foreign Bodies seen or palpated  Anesthesia: local infiltration  Local anesthetic: lidocaine 2% w epinephrine  Anesthetic total: 2 ml  Irrigation method: syringe Amount of cleaning: standard  Skin closure: staples  Number of sutures: 3  Technique: staples  Patient tolerance: Patient tolerated the procedure well with no immediate complications.  Removal in 7 days, wound care and tetanus information given.  Medications  lidocaine-EPINEPHrine (XYLOCAINE W/EPI) 2 %-1:100000 (with pres) injection 20 mL (not administered)  Tdap (BOOSTRIX) injection 0.5 mL (0.5 mLs Intramuscular Given 05/12/15 1123)     I feel the patient has had an appropriate workup for their chief complaint at this time and likelihood of emergent condition existing is low. Discussed s/sx that warrant return to the ED.  Filed Vitals:   05/12/15 1101  BP: 151/95  Pulse: 83  Temp: 98 F (36.7 C)  Resp: [address] PA-C 05/12/15 1146  Gerhard Munch, MD 05/13/15 [phone]

## 2015-05-23 ENCOUNTER — Emergency Department (HOSPITAL_COMMUNITY)
Admission: EM | Admit: 2015-05-23 | Discharge: 2015-05-23 | Disposition: A | Discharge status: Home / Self Care | Payer: BLUE CROSS/BLUE SHIELD | Attending: Emergency Medicine | Admitting: Emergency Medicine

## 2015-05-23 ENCOUNTER — Encounter (HOSPITAL_COMMUNITY): Payer: Self-pay | Admitting: Emergency Medicine

## 2015-05-23 DIAGNOSIS — Z7952 Long term (current) use of systemic steroids: Secondary | ICD-10-CM | POA: Diagnosis not present

## 2015-05-23 DIAGNOSIS — Z4802 Encounter for removal of sutures: Secondary | ICD-10-CM | POA: Insufficient documentation

## 2015-05-23 DIAGNOSIS — F17210 Nicotine dependence, cigarettes, uncomplicated: Secondary | ICD-10-CM | POA: Insufficient documentation

## 2015-05-23 DIAGNOSIS — Z79899 Other long term (current) drug therapy: Secondary | ICD-10-CM | POA: Diagnosis not present

## 2015-05-23 NOTE — ED Notes (Signed)
Pt states he is here for staple removal of lt lower leg.  Healing well. No problems.

## 2015-05-23 NOTE — ED Provider Notes (Signed)
CSN: [id number]     Arrival date & time 05/23/15  0940 History   First MD Initiated Contact with Patient 05/23/15 0957     Chief Complaint  Patient presents with  . Suture / Staple Removal   Anthony Carroll is a 39 y.o. male who presents to the emergency department for staple removal after a laceration repair on 05/12/2015. Patient had 3 staples placed 11 days ago to his left lower extremity. Tetanus is up-to-date. He denies any pain at the site. He denies any redness, swelling or discharge from the site. He denies any numbness, weakness or fevers.  (Consider location/radiation/quality/duration/timing/severity/associated sxs/prior Treatment) HPI  History reviewed. No pertinent past medical history. Past Surgical History  Procedure Laterality Date  . Ankle surgery      left 1999   Family History  Problem Relation Age of Onset  . Diabetes Father   . Hypertension Other    Social History  Substance Use Topics  . Smoking status: Current Every Day Smoker -- 1.00 packs/day for 6 years    Types: Cigarettes  . Smokeless tobacco: None  . Alcohol Use: No    Review of Systems  Constitutional: Negative for fever.  Cardiovascular: Negative for leg swelling.  Musculoskeletal: Negative for arthralgias.  Skin: Negative for color change and rash.  Neurological: Negative for weakness and numbness.      Allergies  Review of patient's allergies indicates no known allergies.  Home Medications   Prior to Admission medications   Medication Sig Start Date End Date Taking? Authorizing Provider  cetirizine (ZYRTEC) 10 MG tablet Take 10 mg by mouth daily.    Historical Provider, MD  clindamycin (CLEOCIN) 300 MG capsule Take 1 capsule (300 mg total) by mouth every 6 (six) hours. 09/22/13   Linwood Dibbles, MD  HYDROcodone-acetaminophen (NORCO/VICODIN) 5-325 MG per tablet Take 1-2 tablets by mouth every 4 (four) hours as needed. 09/22/13   Linwood Dibbles, MD  Hydrocortisone (CORTIZONE-10 ECZEMA EX) Apply 1  application topically daily.    Historical Provider, MD  tetrahydrozoline (VISINE) 0.05 % ophthalmic solution Place 2 drops into both eyes daily at 12 noon.    Historical Provider, MD   BP 120/89 mmHg  Pulse 74  Temp(Src) 98.1 F (36.7 C) (Oral)  Resp 18  SpO2 100% Physical Exam  Constitutional: He appears well-developed and well-nourished. No distress.  HENT:  Head: Normocephalic and atraumatic.  Eyes: Right eye exhibits no discharge. Left eye exhibits no discharge.  Pulmonary/Chest: Effort normal. No respiratory distress.  Neurological: He is alert. Coordination normal.  Skin: Skin is warm and dry. No rash noted. He is not diaphoretic. No erythema. No pallor.  2.5 cm healing laceration to his left lower extremity. Wound is healing well. No discharge from the wound. No overlying or surrounding erythema or edema. 3 staples removed.  Psychiatric: He has a normal mood and affect. His behavior is normal.  Nursing note and vitals reviewed.   ED Course  .Suture Removal Date/Time: 05/23/2015 10:00 AM Performed by: Everlene Farrier Authorized by: Everlene Farrier Consent: Verbal consent obtained. Risks and benefits: risks, benefits and alternatives were discussed Consent given by: patient Patient understanding: patient states understanding of the procedure being performed Patient consent: the patient's understanding of the procedure matches consent given Procedure consent: procedure consent matches procedure scheduled Relevant documents: relevant documents present and verified Test results: test results available and properly labeled Site marked: the operative site was marked Required items: required blood products, implants, devices, and special equipment available Patient  identity confirmed: verbally with patient Time out: Immediately prior to procedure a "time out" was called to verify the correct patient, procedure, equipment, support staff and site/side marked as required. Body  area: lower extremity Location details: left lower leg Wound Appearance: clean Staples Removed: 3 Post-removal: dressing applied Facility: sutures placed in this facility Patient tolerance: Patient tolerated the procedure well with no immediate complications   (including critical care time) Labs Review Labs Reviewed - No data to display  Imaging Review No results found.   EKG Interpretation None      Filed Vitals:   05/23/15 0954  BP: 120/89  Pulse: 74  Temp: 98.1 F (36.7 C)  TempSrc: Oral  Resp: 18  SpO2: 100%     MDM   Final diagnoses:  Encounter for staple removal   Staple removal   Pt to ER for staple/suture removal and wound check to left lower leg as above. 3 staples removed.  Procedure tolerated well. Vitals normal, no signs of infection. Scar minimization & return precautions given at dc. I advised the patient to follow-up with their primary care provider this week. I advised the patient to return to the emergency department with new or worsening symptoms or new concerns. The patient verbalized understanding and agreement with plan.       Everlene FarrierWilliam Larose Batres, PA-C 05/23/15 1009  Benjiman CoreNathan Pickering, MD 05/23/15 [phone]

## 2015-05-23 NOTE — Discharge Instructions (Signed)
Suture Removal, Care After Refer to this sheet in the next few weeks. These instructions provide you with information on caring for yourself after your procedure. Your health care provider may also give you more specific instructions. Your treatment has been planned according to current medical practices, but problems sometimes occur. Call your health care provider if you have any problems or questions after your procedure. WHAT TO EXPECT AFTER THE PROCEDURE After your stitches (sutures) are removed, it is typical to have the following:  Some discomfort and swelling in the wound area.  Slight redness in the area. HOME CARE INSTRUCTIONS   If you have skin adhesive strips over the wound area, do not take the strips off. They will fall off on their own in a few days. If the strips remain in place after 14 days, you may remove them.  Change any bandages (dressings) at least once a day or as directed by your health care provider. If the bandage sticks, soak it off with warm, soapy water.  Apply cream or ointment only as directed by your health care provider. If using cream or ointment, wash the area with soap and water 2 times a day to remove all the cream or ointment. Rinse off the soap and pat the area dry with a clean towel.  Keep the wound area dry and clean. If the bandage becomes wet or dirty, or if it develops a bad smell, change it as soon as possible.  Continue to protect the wound from injury.  Use sunscreen when out in the sun. New scars become sunburned easily. SEEK MEDICAL CARE IF:  You have increasing redness, swelling, or pain in the wound.  You see pus coming from the wound.  You have a fever.  You notice a bad smell coming from the wound or dressing.  Your wound breaks open (edges not staying together).   This information is not intended to replace advice given to you by your health care provider. Make sure you discuss any questions you have with your health care  provider.   Document Released: 02/27/2001 Document Revised: 03/25/2013 Document Reviewed: 01/14/2013 Elsevier Interactive Patient Education 2016 Else[employer] Inc[employer]  Incision Care An incision is when a surgeon cuts into your body. After surgery, the incision needs to be cared for properly to prevent infection.  HOW TO CARE FOR YOUR INCISION  Take medicines only as directed by your health care provider.  There are many different ways to close and cover an incision, including stitches, skin glue, and adhesive strips. Follow your health care provider's instructions on:  Incision care.  Bandage (dressing) changes and removal.  Incision closure removal.  Do not take baths, swim, or use a hot tub until your health care provider approves. You may shower as directed by your health care provider.  Resume your normal diet and activities as directed.  Use anti-itch medicine (such as an antihistamine) as directed by your health care provider. The incision may itch while it is healing. Do not pick or scratch at the incision.  Drink enough fluid to keep your urine clear or pale yellow. SEEK MEDICAL CARE IF:   You have drainage, redness, swelling, or pain at your incision site.  You have muscle aches, chills, or a general ill feeling.  You notice a bad smell coming from the incision or dressing.  Your incision edges separate after the sutures, staples, or skin adhesive strips have been removed.  You have persistent nausea or vomiting.  You have a  fever.  You are dizzy. SEEK IMMEDIATE MEDICAL CARE IF:   You have a rash.  You faint.  You have difficulty breathing. MAKE SURE YOU:   Understand these instructions.  Will watch your condition.  Will get help right away if you are not doing well or get worse.   This information is not intended to replace advice given to you by your health care provider. Make sure you discuss any questions you have with your health care provider.     Document Released: 12/22/2004 Document Revised: 06/25/2014 Document Reviewed: 07/29/2013 Elsevier Interactive Patient Education [employer]

## 2015-12-21 ENCOUNTER — Encounter (HOSPITAL_COMMUNITY): Payer: Self-pay | Admitting: Emergency Medicine

## 2015-12-21 ENCOUNTER — Emergency Department (HOSPITAL_COMMUNITY): Payer: BLUE CROSS/BLUE SHIELD

## 2015-12-21 ENCOUNTER — Emergency Department (HOSPITAL_COMMUNITY)
Admission: EM | Admit: 2015-12-21 | Discharge: 2015-12-21 | Disposition: A | Discharge status: Home / Self Care | Payer: BLUE CROSS/BLUE SHIELD | Attending: Emergency Medicine | Admitting: Emergency Medicine

## 2015-12-21 DIAGNOSIS — R51 Headache: Secondary | ICD-10-CM | POA: Insufficient documentation

## 2015-12-21 DIAGNOSIS — F17210 Nicotine dependence, cigarettes, uncomplicated: Secondary | ICD-10-CM | POA: Insufficient documentation

## 2015-12-21 DIAGNOSIS — R079 Chest pain, unspecified: Secondary | ICD-10-CM | POA: Insufficient documentation

## 2015-12-21 LAB — I-STAT TROPONIN, ED
Troponin i, poc: 0.01 ng/mL (ref 0.00–0.08)
Troponin i, poc: 0 ng/mL (ref 0.00–0.08)

## 2015-12-21 LAB — BASIC METABOLIC PANEL
Anion gap: 8 (ref 5–15)
BUN: 14 mg/dL (ref 6–20)
CHLORIDE: 104 mmol/L (ref 101–111)
CO2: 24 mmol/L (ref 22–32)
CREATININE: 0.87 mg/dL (ref 0.61–1.24)
Calcium: 9 mg/dL (ref 8.9–10.3)
GFR calc Af Amer: >60 mL/min (ref >60)
GFR calc non Af Amer: >60 mL/min (ref >60)
GLUCOSE: 95 mg/dL (ref 65–99)
Potassium: 4 mmol/L (ref 3.5–5.1)
SODIUM: 136 mmol/L (ref 135–145)

## 2015-12-21 LAB — CBC
HCT: 48 % (ref 39.0–52.0)
Hemoglobin: 16.1 g/dL (ref 13.0–17.0)
MCH: 29 pg (ref 26.0–34.0)
MCHC: 33.5 g/dL (ref 30.0–36.0)
MCV: 86.5 fL (ref 78.0–100.0)
PLATELETS: 193 10*3/uL (ref 150–400)
RBC: 5.55 MIL/uL (ref 4.22–5.81)
RDW: 15 % (ref 11.5–15.5)
WBC: 6.2 10*3/uL (ref 4.0–10.5)

## 2015-12-21 MED ORDER — PANTOPRAZOLE SODIUM 20 MG PO TBEC: 20.0000 mg | DELAYED_RELEASE_TABLET | Freq: Two times a day (BID) | ORAL | Status: DC

## 2015-12-21 NOTE — ED Notes (Signed)
Pt c/o burning left chest pain radiating down left arm since this morning. Pt reports SOB and denies N/V.

## 2015-12-21 NOTE — ED Notes (Signed)
RN is starting a lV line and blood work 

## 2015-12-21 NOTE — Progress Notes (Signed)
CM spoke with pt who confirms uninsured [place] resident with no pcp.  CM discussed and provided written information to assist pt with determining choice for uninsured accepting pcps, discussed the importance of pcp vs EDP services for f/u care, www.needymeds.org, www.goodrx.com, discounted pharmacies and other [place] resources such as [facility] , [facility], affordable care act, financial assistance, uninsured dental services, [facility] assist, DSS and  health department  Reviewed resources for [place] uninsured accepting pcps like [facility], [facility] [address], [facility], [facility], local urgent care centers, [facility], [facility], general medical clinics, [facility], [facility] plus others, medication resources, [facility] out patient [facility]ies and housing Pt voiced understanding and appreciation of resources provided   Provided [facility] contact information 

## 2015-12-21 NOTE — Progress Notes (Addendum)
CM spoke with [place] who [place].  CM discussed and provided written information to assist pt with determining choice for uninsured accepting pcps, discussed the importance of pcp vs EDP services for f/u care, www.needymeds.org, www.goodrx.com, discounted pharmacies and other [place] resources such as [facility] , [facility], affordable care act, financial assistance, uninsured dental services, [place] med assist, DSS and  health department  Reviewed resources for [place] uninsured accepting pcps like [facility], [facility] medicine [facility] [address], [facility], [facility], local urgent care centers, [facility], [facility], general medical clinics, [facility], [facility] plus others, medication resources, [facility] out patient [facility]ies and housing Pt voiced understanding and appreciation of resources provided   Provided [facility] contact information Pt prefers to go to "urgent care"

## 2015-12-21 NOTE — ED Provider Notes (Signed)
CSN: [id number]     Arrival date & time 12/21/15  1218 History   First MD Initiated Contact with Patient 12/21/15 1237     Chief Complaint  Patient presents with  . Chest Pain     (Consider location/radiation/quality/duration/timing/severity/associated sxs/prior Treatment) HPI   3AM developed sharp burning chest pain, off and on, left arm weakness/heaviness with last episode-occurred about 12PM.  Occurs at rest.  Not worse with exertion.  Feels better laying on stomach.  No hx of prior CP  No hx medical problems  Quit smoking 4 days  No family hx heart disease  No long trips, surgeries, hx of blood clots    History reviewed. No pertinent past medical history. Past Surgical History  Procedure Laterality Date  . Ankle surgery      left 1999   Family History  Problem Relation Age of Onset  . Diabetes Father   . Hypertension Other    Social History  Substance Use Topics  . Smoking status: Current Every Day Smoker -- 1.00 packs/day for 6 years    Types: Cigarettes  . Smokeless tobacco: None  . Alcohol Use: No    Review of Systems  Constitutional: Negative for fever.  HENT: Negative for sore throat.   Eyes: Negative for visual disturbance.  Respiratory: Negative for shortness of breath.   Cardiovascular: Positive for chest pain.  Gastrointestinal: Negative for nausea, vomiting and abdominal pain.  Genitourinary: Negative for dysuria and difficulty urinating.  Musculoskeletal: Negative for back pain, neck pain and neck stiffness.  Skin: Negative for rash.  Neurological: Positive for headaches (mild HA today, thinks because didn't eat). Negative for syncope, weakness, light-headedness and numbness.      Allergies  Review of patient's allergies indicates no known allergies.  Home Medications   Prior to Admission medications   Medication Sig Start Date End Date Taking? Authorizing Provider  pantoprazole (PROTONIX) 20 MG tablet Take 1 tablet (20 mg total) by  mouth 2 (two) times daily. 12/21/15 01/11/16  Alvira MondayErin Neyra Pettie, MD   BP 125/79 mmHg  Pulse 61  Temp(Src) 98.7 F (37.1 C) (Oral)  Resp 18  Ht 5\' 9"  (1.753 m)  Wt 192 lb (87.091 kg)  BMI 28.34 kg/m2  SpO2 100% Physical Exam  Constitutional: He is oriented to person, place, and time. He appears well-developed and well-nourished. No distress.  HENT:  Head: Normocephalic and atraumatic.  Eyes: Conjunctivae and EOM are normal.  Neck: Normal range of motion.  Cardiovascular: Normal rate, regular rhythm, normal heart sounds and intact distal pulses.  Exam reveals no gallop and no friction rub.   No murmur heard. Pulmonary/Chest: Effort normal and breath sounds normal. No respiratory distress. He has no wheezes. He has no rales.  Abdominal: Soft. He exhibits no distension. There is no tenderness. There is no guarding.  Musculoskeletal: He exhibits no edema.  Neurological: He is alert and oriented to person, place, and time.  Skin: Skin is warm and dry. He is not diaphoretic.  Nursing note and vitals reviewed.   ED Course  Procedures (including critical care time) Labs Review Labs Reviewed  BASIC METABOLIC PANEL  CBC  I-STAT TROPOININ, ED  I-STAT TROPOININ, ED  Rosezena SensorI-STAT TROPOININ, ED    Imaging Review Dg Chest 2 View  12/21/2015  CLINICAL DATA:  Left-sided chest pain EXAM: CHEST  2 VIEW COMPARISON:  09/17/2012 FINDINGS: The heart size and mediastinal contours are within normal limits. Both lungs are clear. The visualized skeletal structures are unremarkable. IMPRESSION: No active  cardiopulmonary disease. Electronically Signed   By: Signa Kellaylor  Stroud M.D.   On: 12/21/2015 13:09   I have personally reviewed and evaluated these images and lab results as part of my medical decision-making.   EKG Interpretation   Date/Time:  Wednesday December 21 2015 12:24:02 EDT Ventricular Rate:  71 PR Interval:    QRS Duration: 79 QT Interval:  387 QTC Calculation: 421 R Axis:   65 Text Interpretation:   Sinus rhythm TW more prominent in anterior leads,  otherqise no significant changes from prior ECG in April 2014 Confirmed by  [facility] MD, Laney Bagshaw ([id number]) on 12/21/2015 1:27:22 PM      MDM   Final diagnoses:  Chest pain, unspecified chest pain type   40 year old male with no significant medical history  presents with concern of chest pain. Differential diagnosis for chest pain includes pulmonary embolus, dissection, pneumothorax, pneumonia, ACS, myocarditis, pericarditis.  EKG was done and evaluate by me and showed no acute ST changes and no signs of pericarditis. Reports a heaviness in his arm when CP happens, however has normal neurologic exam and denies focal weakness. Doubt CVA/TIA. Chest x-ray was done and evaluated by me and radiology and showed no sign of pneumonia or pneumothorax, no mediastinal widening. Equal pulses bilaterally and low suspicion for dissection by hx, XR and physical  Patient is PERC negative and low risk [place] and have low suspicion for PE.  Patient is low risk HEART score and had delta troponins which were both negative.  Given this evaluation, history and physical have low suspicion for pulmonary embolus, pneumonia, ACS, myocarditis, pericarditis, dissection.   Patient appropriate for outpatient follow up within 1 week.  Will initiate PPI given burning nature of pain. Patient discharged in stable condition with understanding of reasons to return and recommend PCP follow up.   Alvira MondayErin Nayan Proch, MD 12/22/15 1158

## 2016-09-12 ENCOUNTER — Encounter (HOSPITAL_COMMUNITY): Payer: Self-pay | Admitting: *Deleted

## 2016-09-12 ENCOUNTER — Emergency Department (HOSPITAL_COMMUNITY)
Admission: EM | Admit: 2016-09-12 | Discharge: 2016-09-12 | Disposition: A | Discharge status: Home / Self Care | Payer: BLUE CROSS/BLUE SHIELD | Attending: Emergency Medicine | Admitting: Emergency Medicine

## 2016-09-12 DIAGNOSIS — R21 Rash and other nonspecific skin eruption: Secondary | ICD-10-CM

## 2016-09-12 DIAGNOSIS — L299 Pruritus, unspecified: Secondary | ICD-10-CM | POA: Insufficient documentation

## 2016-09-12 DIAGNOSIS — F17210 Nicotine dependence, cigarettes, uncomplicated: Secondary | ICD-10-CM | POA: Insufficient documentation

## 2016-09-12 MED ORDER — TRIAMCINOLONE ACETONIDE 0.1 % EX CREA: 1.0000 "application " | TOPICAL_CREAM | Freq: Two times a day (BID) | CUTANEOUS | 0 refills | Status: DC

## 2016-09-12 NOTE — Discharge Instructions (Signed)
This is likely psoriasis or eczema. Please use the Kenalog cream to the affected area after applying the cream please make sure you're using a thick lotion to keep the area moist. Use over-the-counter Cetaphil soap and avoid car soaps and chemicals. Use over-the-counter oatmeal baths. May find them in the pharmacy department in retail stores. Take Zyrtec during the day for itching. Take 50 mg of Benadryl at night for itching. Follow up with a primary care doctor. You may need to be referred to a dermatologist.

## 2016-09-12 NOTE — ED Triage Notes (Signed)
Pt reports burning rash and itching to bilateral posterior legs and bilateral arms. Hx of same occurring this time every year, has been told he has eczema.

## 2016-09-12 NOTE — ED Provider Notes (Signed)
[facility]EMER[facility]ENCY DEPT Provider Note   CSN: [id number] Arrival date & time: 09/12/16  [id number]   By signing my name below, I, Clovis PuAvnee Patel, attest that this documentation has been prepared under the direction and in the presence of  Demetrios LollKenneth Leaphart, PA-C. Electronically Signed: Clovis PuAvnee Patel, ED Scribe. 09/12/16. 11:13 AM.   History   Chief Complaint Chief Complaint  Patient presents with  . Rash    HPI Comments:  Anthony Carroll is a 41 y.o. male, with a PMHx of eczema, who presents to the Emergency Department complaining of acute onset, moderate rash with a burning sensation to his extremities x 1 week. He also reports associated skin itching. Pt states he is frequently sweaty and works around dust particles and pollen that exacerbates his symptoms. Pt states he experiences a similar rash once a year however this seems slightly worse. He has applied Vaseline and anti-itch cream with mild relief. Pt denies using any new skin products, new medication use or any other associated symptoms. Denies any fever, chills, nausea, emesis   The history is provided by the patient. No language interpreter was used.    History reviewed. No pertinent past medical history.  There are no active problems to display for this patient.   Past Surgical History:  Procedure Laterality Date  . ANKLE SURGERY     left 1999    Home Medications    Prior to Admission medications   Medication Sig Start Date End Date Taking? Authorizing Provider  pantoprazole (PROTONIX) 20 MG tablet Take 1 tablet (20 mg total) by mouth 2 (two) times daily. 12/21/15 01/11/16  Alvira MondayErin Schlossman, MD    Family History Family History  Problem Relation Age of Onset  . Diabetes Father   . Hypertension Other     Social History Social History  Substance Use Topics  . Smoking status: Current Every Day Smoker    Packs/day: 1.00    Years: 6.00    Types: Cigarettes  . Smokeless tobacco: Not on file  . Alcohol use No     Allergies     Patient has no known allergies.   Review of Systems Review of Systems  Constitutional: Negative for fever.  Skin: Positive for rash.  All other systems reviewed and are negative.    Physical Exam Updated Vital Signs BP 138/85 (BP Location: Right Arm)   Pulse 98   Temp 98.4 F (36.9 C) (Oral)   Resp 18   SpO2 100%   Physical Exam  Constitutional: He is oriented to person, place, and time. He appears well-developed and well-nourished. No distress.  HENT:  Head: Normocephalic and atraumatic.  Eyes: Conjunctivae are normal.  Cardiovascular: Normal rate and intact distal pulses.   Pulmonary/Chest: Effort normal.  Abdominal: He exhibits no distension.  Neurological: He is alert and oriented to person, place, and time.  Skin: Skin is warm and dry. Capillary refill takes less than 2 seconds.  Diffuse erythematous maculopapular rash noted to the extensor regions of the bilateral arms the flexor regions of the bilateral knees. Slightly warm to touch but no signs of surrounding cellulitis. Likely due to itching. Rash is pruritic in nature. No drainage from the area.  Psychiatric: He has a normal mood and affect.  Nursing note and vitals reviewed.         ED Treatments / Results  DIAGNOSTIC STUDIES:  Oxygen Saturation is 100% on RA, normal by my interpretation.    COORDINATION OF CARE:  11:11 AM Discussed treatment plan with pt  at bedside and pt agreed to plan.  Labs (all labs ordered are listed, but only abnormal results are displayed) Labs Reviewed - No data to display  EKG  EKG Interpretation None       Radiology No results found.  Procedures Procedures (including critical care time)  Medications Ordered in ED Medications - No data to display   Initial Impression / Assessment and Plan / ED Course  I have reviewed the triage vital signs and the nursing notes.  Pertinent labs & imaging results that were available during my care of the patient were  reviewed by me and considered in my medical decision making (see chart for details).     Patient presents to the ED with history of eczema with diffuse rash to his bilateral extremities. Rash seems to be localized to the extensor region of the upper extremities and Flexeril to the lower extremities. States he gets this once a year however this is worse. Does work around dust and sweats easily. Likely worsening of the rash. Denies any new soaps or medications. Rash does not seem consistent with [facility]. There is no signs of secondary infection concerning for abscess, cellulitis. Denies any fever, chills, nausea, vomiting. No purulent drainage. Rash seems consistent with contact dermatitis such as eczema versus psoriasis. Encourage patient to switch soaps to Cetaphil. Have given a prescription for Kenalog and encourage patient to use thick emollients. Encouraged Benadryl for itching. Follow up with PCP and dermatology in 2-3 days. Return precautions discussed. Pt is safe for discharge at this time.       Final Clinical Impressions(s) / ED Diagnoses   Final diagnoses:  Rash    New Prescriptions New Prescriptions   TRIAMCINOLONE CREAM (KENALOG) 0.1 %    Apply 1 application topically 2 (two) times daily. Apply to rash  I personally performed the services described in this documentation, which was scribed in my presence. The recorded information has been reviewed and is accurate.     Rise Mu, PA-C 09/12/16 1135    Laurence Spates, MD 09/12/16 [phone]

## 2016-11-08 ENCOUNTER — Encounter (HOSPITAL_COMMUNITY): Payer: Self-pay | Admitting: Emergency Medicine

## 2016-11-08 ENCOUNTER — Ambulatory Visit (HOSPITAL_COMMUNITY)
Admission: EM | Admit: 2016-11-08 | Discharge: 2016-11-08 | Disposition: A | Discharge status: Home / Self Care | Payer: BLUE CROSS/BLUE SHIELD | Attending: Family Medicine | Admitting: Family Medicine

## 2016-11-08 DIAGNOSIS — L239 Allergic contact dermatitis, unspecified cause: Secondary | ICD-10-CM | POA: Diagnosis not present

## 2016-11-08 HISTORY — DX: Dermatitis, unspecified: L30.9

## 2016-11-08 MED ORDER — METHYLPREDNISOLONE 4 MG PO TBPK: ORAL_TABLET | ORAL | 0 refills | Status: DC

## 2016-11-08 NOTE — ED Triage Notes (Signed)
Pt here for eczema flare up onset 2 weeks all over body.   Reports he has been applying Vaseline w/no temp relief  A&O x4... NAD... Ambulatory

## 2016-11-08 NOTE — Discharge Instructions (Signed)
Take the Medrol Dosepak as directed. Take with food. Apply this should be sufficient for treatment. Because the rash is so widespread to such a significant body surface area it would take a great deal of cream for this. Recommend using Cetaphil lotion. May also take antihistamines for itching. He may start with nondrowsy such as Allegra or Zyrtec. If not strong enough she may take Benadryl every 4-6 hours at this may cause drowsiness.

## 2016-11-08 NOTE — ED Provider Notes (Signed)
CSN: [id number]     Arrival date & time 11/08/16  [id number] History   First MD Initiated Contact with Patient 11/08/16 1013     Chief Complaint  Patient presents with  . Eczema   (Consider location/radiation/quality/duration/timing/severity/associated sxs/prior Treatment) Old male with history of seasonal eczema presents today with a generalized rash primarily involving the extremities and the back. It is quite similar to previous episodes. There are photographs from a previous visit. The rash is a little worse than those photographs. Very pruritic.      Past Medical History:  Diagnosis Date  . Eczema    Past Surgical History:  Procedure Laterality Date  . ANKLE SURGERY     left 1999   Family History  Problem Relation Age of Onset  . Diabetes Father   . Hypertension Other    Social History  Substance Use Topics  . Smoking status: Current Every Day Smoker    Packs/day: 1.00    Years: 6.00    Types: Cigarettes  . Smokeless tobacco: Never Used  . Alcohol use Yes    Review of Systems  Constitutional: Negative.   HENT: Negative.   Respiratory: Negative.   Gastrointestinal: Negative.   Skin: Positive for rash.  Neurological: Negative.   All other systems reviewed and are negative.   Allergies  Patient has no known allergies.  Home Medications   Prior to Admission medications   Medication Sig Start Date End Date Taking? Authorizing Provider  pantoprazole (PROTONIX) 20 MG tablet Take 1 tablet (20 mg total) by mouth 2 (two) times daily. 12/21/15 01/11/16  Alvira MondaySchlossman, Erin, MD  triamcinolone cream (KENALOG) 0.1 % Apply 1 application topically 2 (two) times daily. Apply to rash 09/12/16   Rise MuLeaphart, Kenneth T, PA-C   Meds Ordered and Administered this Visit  Medications - No data to display  BP 105/69 (BP Location: Left Arm)   Pulse 90   Temp 98.7 F (37.1 C) (Oral)   Resp 16   SpO2 95%  No data found.   Physical Exam  Constitutional: He is oriented to person, place,  and time. He appears well-developed and well-nourished. No distress.  Eyes: EOM are normal.  Neck: Neck supple.  Cardiovascular: Normal rate.   Pulmonary/Chest: Effort normal. No respiratory distress.  Musculoskeletal: He exhibits no edema.  Neurological: He is alert and oriented to person, place, and time. He exhibits normal muscle tone.  Skin: Skin is warm and dry. Rash noted. There is erythema.  Rough, scaly, erythematous rash mixed with papules, scales diffusely distributed to the extensor and flexor surfaces of the upper extremities, primarily extensor surface of the lower extremities and to portions of the back. The face is spared. No evidence of infection. Currently no drainage, bleeding.  Psychiatric: He has a normal mood and affect.  Nursing note and vitals reviewed.   Urgent Care Course     Procedures (including critical care time)  Labs Review Labs Reviewed - No data to display  Imaging Review No results found.   Visual Acuity Review  Right Eye Distance:   Left Eye Distance:   Bilateral Distance:    Right Eye Near:   Left Eye Near:    Bilateral Near:         MDM   1. Allergic eczema    Take the Medrol Dosepak as directed. Take with food. Apply this should be sufficient for treatment. Because the rash is so widespread to such a significant body surface area it would take a great  deal of cream for this. Recommend using Cetaphil lotion. May also take antihistamines for itching. He may start with nondrowsy such as Allegra or Zyrtec. If not strong enough she may take Benadryl every 4-6 hours at this may cause drowsiness. No orders of the defined types were placed in this encounter.  Meds ordered this encounter  Medications  . methylPREDNISolone (MEDROL DOSEPAK) 4 MG TBPK tablet    Sig: follow package directions    Dispense:  21 tablet    Refill:  0    Order Specific Question:   Supervising Provider    Answer:   Maudry Diego,  NP 11/08/16 1029

## 2017-03-13 ENCOUNTER — Emergency Department (HOSPITAL_COMMUNITY)
Admission: EM | Admit: 2017-03-13 | Discharge: 2017-03-13 | Disposition: A | Discharge status: Home / Self Care | Payer: 59 | Attending: Emergency Medicine | Admitting: Emergency Medicine

## 2017-03-13 ENCOUNTER — Encounter (HOSPITAL_COMMUNITY): Payer: Self-pay | Admitting: Emergency Medicine

## 2017-03-13 DIAGNOSIS — Y9259 Other trade areas as the place of occurrence of the external cause: Secondary | ICD-10-CM | POA: Diagnosis not present

## 2017-03-13 DIAGNOSIS — Y990 Civilian activity done for income or pay: Secondary | ICD-10-CM | POA: Insufficient documentation

## 2017-03-13 DIAGNOSIS — T148XXA Other injury of unspecified body region, initial encounter: Secondary | ICD-10-CM

## 2017-03-13 DIAGNOSIS — S39012A Strain of muscle, fascia and tendon of lower back, initial encounter: Secondary | ICD-10-CM | POA: Insufficient documentation

## 2017-03-13 DIAGNOSIS — Z79899 Other long term (current) drug therapy: Secondary | ICD-10-CM | POA: Diagnosis not present

## 2017-03-13 DIAGNOSIS — Y9389 Activity, other specified: Secondary | ICD-10-CM | POA: Diagnosis not present

## 2017-03-13 DIAGNOSIS — X500XXA Overexertion from strenuous movement or load, initial encounter: Secondary | ICD-10-CM | POA: Insufficient documentation

## 2017-03-13 DIAGNOSIS — F17210 Nicotine dependence, cigarettes, uncomplicated: Secondary | ICD-10-CM | POA: Diagnosis not present

## 2017-03-13 DIAGNOSIS — M545 Low back pain, unspecified: Secondary | ICD-10-CM

## 2017-03-13 DIAGNOSIS — S3992XA Unspecified injury of lower back, initial encounter: Secondary | ICD-10-CM | POA: Diagnosis present

## 2017-03-13 MED ORDER — METHOCARBAMOL 500 MG PO TABS: 500.0000 mg | ORAL_TABLET | Freq: Two times a day (BID) | ORAL | 0 refills | Status: DC

## 2017-03-13 NOTE — ED Provider Notes (Signed)
[facility]-EMERGENCY DEPT Provider Note   CSN: [id number] Arrival date & time: 03/13/17  1324     History   Chief Complaint Chief Complaint  Patient presents with  . Back Pain  . Ankle Pain    HPI Anthony Carroll is a 41 y.o. male.  Anthony Carroll is a 41 y.o. Male who presents with 2-3 days of low back pain, worse on the left side. Patient describes pain as a constant ache, pain does not radiate anywhere or shoot down the leg. Patient denies any specific injury, but does work at [employer] and does a lot of heavy lifting at work. Pain is made worse with movement, particularly forward bending, and lifting. Patient has tried ibuprofen at home with some improvement. He has had to work and has been unable to rest his back, but feels better when he is able to lay down. Patient is able to walk without difficulty. Denies weakness, numbness, loss of bowel/bladder function or saddle anesthesia. Denies any fevers or chills, no history of cancer or IV drug use. Patient also complains of some left ankle pain, this pain has been present for over a year and patient thinks it may be related to previous fracture, this pain is unchanged and has not become worse or different recently.        Past Medical History:  Diagnosis Date  . Eczema     There are no active problems to display for this patient.   Past Surgical History:  Procedure Laterality Date  . ANKLE SURGERY     left 1999       Home Medications    Prior to Admission medications   Medication Sig Start Date End Date Taking? Authorizing Provider  methocarbamol (ROBAXIN) 500 MG tablet Take 1 tablet (500 mg total) by mouth 2 (two) times daily. 03/13/17   Dartha Lodge, PA-C  methylPREDNISolone (MEDROL DOSEPAK) 4 MG TBPK tablet follow package directions 11/08/16   Hayden Rasmussen, NP  pantoprazole (PROTONIX) 20 MG tablet Take 1 tablet (20 mg total) by mouth 2 (two) times daily. 12/21/15 01/11/16  Alvira Monday, MD  triamcinolone cream (KENALOG) 0.1 %  Apply 1 application topically 2 (two) times daily. Apply to rash 09/12/16   Rise Mu, PA-C    Family History Family History  Problem Relation Age of Onset  . Diabetes Father   . Hypertension Other     Social History Social History  Substance Use Topics  . Smoking status: Current Every Day Smoker    Packs/day: 1.00    Years: 6.00    Types: Cigarettes  . Smokeless tobacco: Never Used  . Alcohol use Yes     Allergies   Patient has no known allergies.   Review of Systems Review of Systems  Constitutional: Negative for chills and fever.  Gastrointestinal: Negative for abdominal distention, abdominal pain, constipation, diarrhea, nausea and vomiting.  Genitourinary: Negative for difficulty urinating and dysuria.  Musculoskeletal: Positive for arthralgias and back pain. Negative for myalgias, neck pain and neck stiffness.  Neurological: Negative for dizziness, weakness, light-headedness and numbness.     Physical Exam Updated Vital Signs BP (!) 149/85 (BP Location: Left Arm)   Pulse 90   Temp 98 F (36.7 C) (Oral)   Resp 16   SpO2 100%   Physical Exam  Constitutional: He is oriented to person, place, and time. He appears well-developed and well-nourished. No distress.  HENT:  Head: Normocephalic and atraumatic.  Eyes: Right eye exhibits no discharge. Left eye exhibits  no discharge.  Neck: Normal range of motion.  Nontender to palpation along C-spine  Pulmonary/Chest: Effort normal. No respiratory distress.  Abdominal: Soft. Bowel sounds are normal. He exhibits no distension and no mass. There is no tenderness. There is no guarding.  No CVA tenderness  Musculoskeletal:  Nontender to palpation at midline of the T-spine and L-spine. Bilateral mild tenderness to palpation paraspinally in the lumbar region. Tender to palpation on left side of thoracic spine, palpable muscle tension. Patient able to perform spinal range of motion, but pain increased with twisting  and forward bending.  Neurological: He is alert and oriented to person, place, and time. Coordination normal.  Speech is clear, able to follow commands CN III-XII intact Normal strength in upper and lower extremities bilaterally including dorsiflexion and plantar flexion, strong and equal grip strength Sensation normal to light and sharp touch Moves extremities without ataxia, coordination intact  Skin: Skin is warm and dry. Capillary refill takes less than 2 seconds. He is not diaphoretic.  Psychiatric: He has a normal mood and affect. His behavior is normal.  Nursing note and vitals reviewed.    ED Treatments / Results  Labs (all labs ordered are listed, but only abnormal results are displayed) Labs Reviewed - No data to display  EKG  EKG Interpretation None       Radiology No results found.  Procedures Procedures (including critical care time)  Medications Ordered in ED Medications - No data to display   Initial Impression / Assessment and Plan / ED Course  I have reviewed the triage vital signs and the nursing notes.  Pertinent labs & imaging results that were available during my care of the patient were reviewed by me and considered in my medical decision making (see chart for details).  Patient presents with 2-3 days of back pain, patient mildly hypertensive, vitals otherwise normal and patient well-appearing. Denies weakness, numbness, loss of bowel/bladder function or saddle anesthesia, no concern for cauda equina. Pain likely due to muscle strain, patient will be discharged home with ibuprofen and Robaxin for symptom management. Encourage patient to use heat and ice. Work note provided, advised patient to rest back, full today and tomorrow he can start with some light stretching. Return precautions provided, patient expresses understanding and agrees with plan.  Final Clinical Impressions(s) / ED Diagnoses   Final diagnoses:  Acute bilateral low back pain without  sciatica  Muscle strain    New Prescriptions Discharge Medication List as of 03/13/2017  2:19 PM    START taking these medications   Details  methocarbamol (ROBAXIN) 500 MG tablet Take 1 tablet (500 mg total) by mouth 2 (two) times daily., Starting Wed 03/13/2017, Print         Dartha Lodge, PA-C 03/13/17 2319    Gerhard Munch, MD 03/14/17 [phone]

## 2017-03-13 NOTE — Discharge Instructions (Signed)
Your pain is likely due to a muscle strain, you can continue to treat this with ibuprofen as well as Robaxin. I encourage you to use heat as well as ice on the injured muscle. Please use the phone number provided to establish care with a family doctor. Return to the emergency department if new or concerning symptoms develop, you have difficulty walking, weakness or numbness in your lower extremities, or unable to controlling your bowel or bladder.

## 2017-03-13 NOTE — ED Triage Notes (Signed)
Pt complaint of low back pain for 2-3 days; no injury; no GU symptoms. Pt continues to verbal left ankle pain for a year; no recent injury.

## 2017-03-14 ENCOUNTER — Encounter (HOSPITAL_COMMUNITY): Payer: Self-pay | Admitting: *Deleted

## 2017-03-14 ENCOUNTER — Ambulatory Visit (HOSPITAL_COMMUNITY)
Admission: EM | Admit: 2017-03-14 | Discharge: 2017-03-14 | Disposition: A | Discharge status: Home / Self Care | Payer: 59 | Attending: Family Medicine | Admitting: Family Medicine

## 2017-03-14 DIAGNOSIS — S29019D Strain of muscle and tendon of unspecified wall of thorax, subsequent encounter: Secondary | ICD-10-CM

## 2017-03-14 DIAGNOSIS — S39012D Strain of muscle, fascia and tendon of lower back, subsequent encounter: Secondary | ICD-10-CM

## 2017-03-14 MED ORDER — TRAMADOL HCL 50 MG PO TABS: 50.0000 mg | ORAL_TABLET | Freq: Four times a day (QID) | ORAL | 0 refills | Status: DC | PRN

## 2017-03-14 NOTE — ED Provider Notes (Signed)
[facility]    CSN: [id number] Arrival date & time: 03/14/17  1036     History   Chief Complaint Chief Complaint  Patient presents with  . Follow-up    HPI Anthony Carroll is a 41 y.o. male.   41 year old male complaining of back pain for "a few days". He states it started gradually after he had been moving barrels and other heavy objects. He feels like he has strained muscles. The pain is located across the para thoracic and paralumbar muscles. He denies focal paresthesias or weakness, saddle anesthesia, incontinence, spinal pain. He is ambulatory. He was seen in the emergency Department for the same complaint yesterday and prescribed Robaxin and Advil. He states his back is not any better. Pain is worse when lying backwards and with certain movements. It is constant.      Past Medical History:  Diagnosis Date  . Eczema     There are no active problems to display for this patient.   Past Surgical History:  Procedure Laterality Date  . ANKLE SURGERY     left 1999       Home Medications    Prior to Admission medications   Medication Sig Start Date End Date Taking? Authorizing Provider  methocarbamol (ROBAXIN) 500 MG tablet Take 1 tablet (500 mg total) by mouth 2 (two) times daily. 03/13/17   Dartha Lodge, PA-C  methylPREDNISolone (MEDROL DOSEPAK) 4 MG TBPK tablet follow package directions 11/08/16   Hayden Rasmussen, NP  pantoprazole (PROTONIX) 20 MG tablet Take 1 tablet (20 mg total) by mouth 2 (two) times daily. 12/21/15 01/11/16  Alvira Monday, MD  triamcinolone cream (KENALOG) 0.1 % Apply 1 application topically 2 (two) times daily. Apply to rash 09/12/16   Rise Mu, PA-C    Family History Family History  Problem Relation Age of Onset  . Diabetes Father   . Hypertension Other     Social History Social History  Substance Use Topics  . Smoking status: Current Every Day Smoker    Packs/day: 1.00    Years: 6.00    Types: Cigarettes  .  Smokeless tobacco: Never Used  . Alcohol use Yes     Allergies   Patient has no known allergies.   Review of Systems Review of Systems  Constitutional: Negative.   Respiratory: Negative.   Gastrointestinal: Negative.   Genitourinary: Negative.   Musculoskeletal: Positive for back pain and myalgias. Negative for arthralgias, gait problem, neck pain and neck stiffness.       As per HPI  Skin: Negative.   Neurological: Negative for dizziness, weakness, numbness and headaches.     Physical Exam Triage Vital Signs ED Triage Vitals  Enc Vitals Group     BP 03/14/17 1146 118/77     Pulse Rate 03/14/17 1146 61     Resp 03/14/17 1146 18     Temp 03/14/17 1146 99.2 F (37.3 C)     Temp Source 03/14/17 1146 Oral     SpO2 03/14/17 1146 98 %     Weight --      Height --      Head Circumference --      Peak Flow --      Pain Score 03/14/17 1156 7     Pain Loc --      Pain Edu? --      Excl. in GC? --    No data found.   Updated Vital Signs BP 118/77 (BP Location: Right Arm)  Pulse 61   Temp 99.2 F (37.3 C) (Oral)   Resp 18   SpO2 98%   Visual Acuity Right Eye Distance:   Left Eye Distance:   Bilateral Distance:    Right Eye Near:   Left Eye Near:    Bilateral Near:     Physical Exam  Constitutional: He is oriented to person, place, and time. He appears well-developed and well-nourished.  HENT:  Head: Normocephalic and atraumatic.  Eyes: EOM are normal. Left eye exhibits no discharge.  Neck: Normal range of motion. Neck supple.  Cardiovascular: Normal rate.   Pulmonary/Chest: Effort normal and breath sounds normal.  Musculoskeletal:  Tenderness to the para thoracic muscles as well as the bilateral para lumbar muscles. No spinal tenderness, deformity, step-off deformity, swelling. Patient is able to sit and lean forward without pain but with a sense of "pulling". Moves all extremities. Strength 5 over 5.  Neurological: He is alert and oriented to person,  place, and time. No cranial nerve deficit.  Skin: Skin is warm and dry.  Psychiatric: He has a normal mood and affect.     UC Treatments / Results  Labs (all labs ordered are listed, but only abnormal results are displayed) Labs Reviewed - No data to display  EKG  EKG Interpretation None       Radiology No results found.  Procedures Procedures (including critical care time)  Medications Ordered in UC Medications - No data to display   Initial Impression / Assessment and Plan / UC Course  I have reviewed the triage vital signs and the nursing notes.  Pertinent labs & imaging results that were available during my care of the patient were reviewed by me and considered in my medical decision making (see chart for details).    Start applying heat to the back muscles. Start gentle stretches as discussed. No heavy lifting or bending. Recommend following up with [facility] next week as needed to assess whether you are ready to return to work or not. Take the medication as directed.     Final Clinical Impressions(s) / UC Diagnoses   Final diagnoses:  Thoracic myofascial strain, subsequent encounter  Lumbosacral strain, subsequent encounter    New Prescriptions New Prescriptions   No medications on file     Controlled Substance Prescriptions [place] Controlled Substance Registry consulted? Not Applicable   Hayden Rasmussen, NP 03/14/17 1237

## 2017-03-14 NOTE — ED Triage Notes (Signed)
Pt  Was   Seen  In  Er  Yesterday  For  Back     Pain   meds      Not  Helping        Pain is  Worse

## 2017-03-14 NOTE — Discharge Instructions (Signed)
Start applying heat to the back muscles. Start gentle stretches as discussed. No heavy lifting or bending. Recommend following up with [facility] [employer] [facility] next week as needed to assess whether you are ready to return to work or not. Take the medication as directed.

## 2017-04-17 ENCOUNTER — Emergency Department (HOSPITAL_COMMUNITY)
Admission: EM | Admit: 2017-04-17 | Discharge: 2017-04-17 | Disposition: A | Discharge status: Home / Self Care | Payer: 59 | Attending: Emergency Medicine | Admitting: Emergency Medicine

## 2017-04-17 ENCOUNTER — Encounter (HOSPITAL_COMMUNITY): Payer: Self-pay

## 2017-04-17 ENCOUNTER — Emergency Department (HOSPITAL_COMMUNITY): Payer: 59

## 2017-04-17 DIAGNOSIS — R42 Dizziness and giddiness: Secondary | ICD-10-CM | POA: Diagnosis not present

## 2017-04-17 DIAGNOSIS — R072 Precordial pain: Secondary | ICD-10-CM | POA: Insufficient documentation

## 2017-04-17 DIAGNOSIS — F17210 Nicotine dependence, cigarettes, uncomplicated: Secondary | ICD-10-CM | POA: Diagnosis not present

## 2017-04-17 DIAGNOSIS — R079 Chest pain, unspecified: Secondary | ICD-10-CM | POA: Diagnosis present

## 2017-04-17 DIAGNOSIS — R0602 Shortness of breath: Secondary | ICD-10-CM | POA: Diagnosis not present

## 2017-04-17 DIAGNOSIS — Z79899 Other long term (current) drug therapy: Secondary | ICD-10-CM | POA: Insufficient documentation

## 2017-04-17 DIAGNOSIS — Z720 Tobacco use: Secondary | ICD-10-CM

## 2017-04-17 LAB — BASIC METABOLIC PANEL
ANION GAP: 10 (ref 5–15)
BUN: 12 mg/dL (ref 6–20)
CALCIUM: 9.1 mg/dL (ref 8.9–10.3)
CO2: 21 mmol/L — AB (ref 22–32)
CREATININE: 0.99 mg/dL (ref 0.61–1.24)
Chloride: 106 mmol/L (ref 101–111)
GFR calc non Af Amer: >60 mL/min (ref >60)
Glucose, Bld: 105 mg/dL — ABNORMAL HIGH (ref 65–99)
Potassium: 4.3 mmol/L (ref 3.5–5.1)
SODIUM: 137 mmol/L (ref 135–145)

## 2017-04-17 LAB — I-STAT TROPONIN, ED
TROPONIN I, POC: 0 ng/mL (ref 0.00–0.08)
TROPONIN I, POC: 0 ng/mL (ref 0.00–0.08)

## 2017-04-17 LAB — CBC
HCT: 46.9 % (ref 39.0–52.0)
HEMOGLOBIN: 15.7 g/dL (ref 13.0–17.0)
MCH: 29.6 pg (ref 26.0–34.0)
MCHC: 33.5 g/dL (ref 30.0–36.0)
MCV: 88.5 fL (ref 78.0–100.0)
PLATELETS: 184 10*3/uL (ref 150–400)
RBC: 5.3 MIL/uL (ref 4.22–5.81)
RDW: 15.4 % (ref 11.5–15.5)
WBC: 5.9 10*3/uL (ref 4.0–10.5)

## 2017-04-17 LAB — D-DIMER, QUANTITATIVE (NOT AT ARMC)

## 2017-04-17 NOTE — Discharge Instructions (Signed)
Your labs, xray, and EKG are all reassuring, your chest pain could be a variety of things including gas pain, muscle pain, and other nonemergent issues, however given your somewhat concerning story, you will need to be further evaluated by a cardiologist in the next 1 week. Alternate between tylenol and motrin as directed as needed for pain. Stay well hydrated. You may consider using heat to the areas of pain, no more than 20 minutes every hour. STOP SMOKING! Follow up with the cardiologist in 1 week for recheck of symptoms and ongoing evaluation of your complaints. Return to the ER for changes or worsening symptoms.  SEEK IMMEDIATE MEDICAL ATTENTION IF: You develop a fever.  Your chest pains become severe or intolerable.  You develop new, unexplained symptoms (problems).  You develop shortness of breath, nausea, vomiting, sweating or feel light headed.  You develop a new cough or you cough up blood. You develop new leg swelling

## 2017-04-17 NOTE — ED Provider Notes (Signed)
[facility]   CSN: [id number] Arrival date & time: 04/17/17  [id number]     History   Chief Complaint Chief Complaint  Patient presents with  . Chest Pain    HPI Anthony Carroll is a 41 y.o. male with a PMHx of eczema, who presents to the ED with complaints of intermittent chest pain that began last night at 9:15 PM when he was [place] of the mall. Patient states that he had chest pain with associated lightheadedness and shortness of breath, which lasted about one hour and 45 minutes before self resolving. He describes the chest pain as initially 9/10 at onset however 4/10 today, intermittent central sharp pain that radiates into the left upper back/shoulder blade area, worse with movement, picking things up, and walking, and improved with laying down/resting. He states that currently he has no chest pain at this moment, however it is intermittent with activity. He does do a lot of heavy lifting and exertional activities at work however denies anything specific prior to onset of pain. He is a cigarette smoker. Denies any family history of cardiac disease. He does not currently have a PCP, however he was seen for a physical at his job in June, and had reassuring labs and cholesterol testing.  He denies diaphoresis, fevers, chills, cough, LE swelling, recent travel/surgery/immobilization, personal/family hx of DVT/PE, abd pain, N/V/D/C, hematuria, dysuria, myalgias, arthralgias, claudication, orthopnea, numbness, tingling, focal weakness, or any other complaints at this time.   The history is provided by the patient and medical records. No language interpreter was used.  Chest Pain   This is a new problem. The current episode started yesterday. Episode frequency: intermittently. The problem has been resolved. The pain is associated with walking. The pain is present in the substernal region. The pain is at a severity of 9/10 (9/10 at onset, 4/10  today). The pain is moderate. The quality of the pain is described as brief and sharp. The pain radiates to the upper back. Duration of episode(s) is 2 hours. Exacerbated by: movement, walking, picking stuff up. Associated symptoms include shortness of breath. Pertinent negatives include no abdominal pain, no claudication, no cough, no diaphoresis, no fever, no lower extremity edema, no nausea, no numbness, no orthopnea, no vomiting and no weakness. He has tried rest for the symptoms. The treatment provided significant relief. Risk factors include male gender and smoking/tobacco exposure.  Pertinent negatives for past medical history include no diabetes, no DVT, no hyperlipidemia, no hypertension and no PE.  Pertinent negatives for family medical history include: no CAD, no early MI and no PE.    Past Medical History:  Diagnosis Date  . Eczema     There are no active problems to display for this patient.   Past Surgical History:  Procedure Laterality Date  . ANKLE SURGERY     left 1999       Home Medications    Prior to Admission medications   Medication Sig Start Date End Date Taking? Authorizing Provider  methocarbamol (ROBAXIN) 500 MG tablet Take 1 tablet (500 mg total) by mouth 2 (two) times daily. 03/13/17   Dartha LodgeFord, Kelsey N, PA-C  methylPREDNISolone (MEDROL DOSEPAK) 4 MG TBPK tablet follow package directions 11/08/16   Hayden RasmussenMabe, David, NP  pantoprazole (PROTONIX) 20 MG tablet Take 1 tablet (20 mg total) by mouth 2 (two) times daily. 12/21/15 01/11/16  Alvira MondaySchlossman, Erin, MD  traMADol (ULTRAM) 50 MG tablet Take 1 tablet (50 mg total) by  mouth every 6 (six) hours as needed. 03/14/17   Hayden Rasmussen, NP  triamcinolone cream (KENALOG) 0.1 % Apply 1 application topically 2 (two) times daily. Apply to rash 09/12/16   Rise Mu, PA-C    Family History Family History  Problem Relation Age of Onset  . Diabetes Father   . Hypertension Other     Social History Social History  Substance  Use Topics  . Smoking status: Current Every Day Smoker    Packs/day: 1.00    Years: 6.00    Types: Cigarettes  . Smokeless tobacco: Never Used  . Alcohol use Yes     Allergies   Patient has no known allergies.   Review of Systems Review of Systems  Constitutional: Negative for chills, diaphoresis and fever.  Respiratory: Positive for shortness of breath. Negative for cough.   Cardiovascular: Positive for chest pain. Negative for orthopnea, claudication and leg swelling.  Gastrointestinal: Negative for abdominal pain, constipation, diarrhea, nausea and vomiting.  Genitourinary: Negative for dysuria and hematuria.  Musculoskeletal: Negative for arthralgias and myalgias.  Skin: Negative for color change.  Allergic/Immunologic: Negative for immunocompromised state.  Neurological: Positive for light-headedness. Negative for weakness and numbness.  Psychiatric/Behavioral: Negative for confusion.   All other systems reviewed and are negative for acute change except as noted in the HPI.    Physical Exam Updated Vital Signs BP (!) 135/91   Pulse 74   Temp 98.4 F (36.9 C) (Oral)   Resp 18   SpO2 100%   Physical Exam  Constitutional: He is oriented to person, place, and time. Vital signs are normal. He appears well-developed and well-nourished.  Non-toxic appearance. No distress.  Afebrile, nontoxic, NAD  HENT:  Head: Normocephalic and atraumatic.  Mouth/Throat: Oropharynx is clear and moist and mucous membranes are normal.  Eyes: Conjunctivae and EOM are normal. Right eye exhibits no discharge. Left eye exhibits no discharge.  Neck: Normal range of motion. Neck supple.  Cardiovascular: Normal rate, regular rhythm, normal heart sounds and intact distal pulses.  Exam reveals no gallop and no friction rub.   No murmur heard. RRR, nl s1/s2, no m/r/g, distal pulses intact, no pedal edema   Pulmonary/Chest: Effort normal and breath sounds normal. No respiratory distress. He has no  decreased breath sounds. He has no wheezes. He has no rhonchi. He has no rales. He exhibits no tenderness, no crepitus, no deformity and no retraction.  CTAB in all lung fields, no w/r/r, no hypoxia or increased WOB, speaking in full sentences, SpO2 100% on RA Chest wall nonTTP without crepitus, deformities, or retractions   Abdominal: Soft. Normal appearance and bowel sounds are normal. He exhibits no distension. There is no tenderness. There is no rigidity, no rebound, no guarding, no CVA tenderness, no tenderness at McBurney's point and negative Murphy's sign.  Musculoskeletal: Normal range of motion.  MAE x4 Strength and sensation grossly intact in all extremities Distal pulses intact No pedal edema, neg homan's bilaterally   Neurological: He is alert and oriented to person, place, and time. He has normal strength. No sensory deficit.  Skin: Skin is warm, dry and intact. No rash noted.  Psychiatric: He has a normal mood and affect.  Nursing note and vitals reviewed.    ED Treatments / Results  Labs (all labs ordered are listed, but only abnormal results are displayed) Labs Reviewed  BASIC METABOLIC PANEL - Abnormal; Notable for the following:       Result Value   CO2 21 (*)  Glucose, Bld 105 (*)    All other components within normal limits  CBC  D-DIMER, QUANTITATIVE (NOT AT [facility])  I-STAT TROPONIN, ED  I-STAT TROPONIN, ED    EKG  EKG Interpretation  Date/Time:  Wednesday April 17 2017 09:22:46 EDT Ventricular Rate:  73 PR Interval:  202 QRS Duration: 74 QT Interval:  382 QTC Calculation: 420 R Axis:   68 Text Interpretation:  Normal sinus rhythm Normal ECG Confirmed by Rolland Porter ([id number]) on 04/17/2017 12:00:42 PM       Radiology Dg Chest 2 View  Result Date: 04/17/2017 CLINICAL DATA:  Chest pain and shortness of breath EXAM: CHEST  2 VIEW COMPARISON:  December 21, 2015 FINDINGS: Lungs clear. Heart size and pulmonary vascularity are normal. No adenopathy. No  pneumothorax. No bone lesions. IMPRESSION: No edema or consolidation. Electronically Signed   By: Bretta Bang III M.D.   On: 04/17/2017 09:49    Procedures Procedures (including critical care time)  Medications Ordered in ED Medications - No data to display   Initial Impression / Assessment and Plan / ED Course  I have reviewed the triage vital signs and the nursing notes.  Pertinent labs & imaging results that were available during my care of the patient were reviewed by me and considered in my medical decision making (see chart for details).     41 y.o. male here with intermittent CP that began last night while he was [place] of the [place], had associated lightheadedness and SOB with it. On exam, no reproducible chest wall tenderness, clear lung exam, no pedal edema, distal pulses symmetric, no tachycardia or hypoxia. Work up thus far: EKG nonischemic, CXR neg, CBC WNL, BMP WNL, trop neg. Pain currently resolved. Will get d-dimer in this low risk patient, if negative then doubt need for further emergent work up. Doubt need for ASA at this point, given neg trop at >12hr mark from initial onset; will likely recheck at 3hrs after first troponin since it's intermittent in nature. Will reassess shortly.   3:20 PM Pt still chest pain free and without recurrent symptoms. D-dimer negative. Second trop neg. Overall, slightly concerning story for CP given the exertional component, however still with low HEART score and with delta trop neg and reassuring work up, doubt need for admission or further emergent work up today. Could still be musculoskeletal given the nature of his job and the fact that it hurts with "pulling on things". However will have him f/up with cardiology for further outpatient evaluation to ensure no cardiac disease. Discussed strict return precautions. Smoking cessation strongly encouraged. F/up with cardiology in 1wk. Discussed case with my attending Dr. Fayrene Fearing who agrees with  plan.  I explained the diagnosis and have given explicit precautions to return to the ER including for any other new or worsening symptoms. The patient understands and accepts the medical plan as it's been dictated and I have answered their questions. Discharge instructions concerning home care and prescriptions have been given. The patient is STABLE and is discharged to home in good condition.     Final Clinical Impressions(s) / ED Diagnoses   Final diagnoses:  Precordial chest pain  Intermittent lightheadedness  SOB (shortness of breath)  Tobacco user    New Prescriptions New Prescriptions   No medications on [address] [place], [place] 04/17/17 1522    Rolland Porter, MD 04/29/17 [phone]

## 2017-04-17 NOTE — ED Triage Notes (Signed)
Patient complains of SSCP x 1 day . No radiation complains of dizziness with same, describes as intermittent. Alert and oriented, NAD

## 2017-04-17 NOTE — ED Notes (Signed)
Patient transported to X-ray 

## 2017-04-17 NOTE — ED Notes (Signed)
Per MD, Pt allowed to eat and drink. Was given graham crackers w/ peanut butter and a ginger ale at 13:53.

## 2017-04-24 ENCOUNTER — Ambulatory Visit (INDEPENDENT_AMBULATORY_CARE_PROVIDER_SITE_OTHER): Payer: 59 | Admitting: Cardiology

## 2017-04-24 ENCOUNTER — Encounter: Payer: Self-pay | Admitting: Cardiology

## 2017-04-24 DIAGNOSIS — Z720 Tobacco use: Secondary | ICD-10-CM | POA: Diagnosis not present

## 2017-04-24 DIAGNOSIS — R079 Chest pain, unspecified: Secondary | ICD-10-CM | POA: Diagnosis not present

## 2017-04-24 NOTE — Patient Instructions (Addendum)
Medication Instructions:  Your physician recommends that you continue on your current medications as directed. Please refer to the Current Medication list given to you today.   Labwork: Your physician recommends that you have the following labs drawn:Liver function and Lipid panel when returning for stress echo; please come fasting  Testing/Procedures: Your physician has requested that you have a stress echocardiogram. For further information please visit [email or web address]. Please follow instruction sheet as given.    Follow-Up: Your physician recommends that you schedule a follow-up appointment in: 6 months   Any Other Special Instructions Will Be Listed Below (If Applicable).     If you need a refill on your cardiac medications before your next appointment, please call your pharmacy.   [facility]  Anthony HamAshley A, RN, BSN   Exercise Stress Echocardiogram An exercise stress echocardiogram is a test to check how well your heart is working. This test uses sound waves (ultrasound) and a computer to make images of your heart before and after exercise. Ultrasound images that are taken before you exercise (your resting echocardiogram) will show how much blood is getting to your heart muscle and how well your heart muscle and heart valves are functioning. During the next part of this test, you will walk on a treadmill or ride a stationary bike to see how exercise affects your heart. While you exercise, the electrical activity of your heart will be monitored with an electrocardiogram (ECG). Your blood pressure will also be monitored. You may have this test if you:  Have chest pain or other symptoms of a heart problem.  Recently had a heart attack or heart surgery.  Have heart valve problems.  Have a condition that causes narrowing of the blood vessels that supply your heart (coronary artery disease).  Have a high risk of heart disease and are starting a new exercise  program.  Have a high risk of heart disease and need to have major surgery.  Tell a health care provider about:  Any allergies you have.  All medicines you are taking, including vitamins, herbs, eye drops, creams, and over-the-counter medicines.  Any problems you or family members have had with anesthetic medicines.  Any blood disorders you have.  Any surgeries you have had.  Any medical conditions you have.  Whether you are pregnant or may be pregnant. What are the risks? Generally, this is a safe procedure. However, problems may occur, including:  Chest pain.  Dizziness or light-headedness.  Shortness of breath.  Increased or irregular heartbeat (palpitations).  Nausea or vomiting.  Heart attack (very rare).  What happens before the procedure?  Follow instructions from your health care provider about eating or drinking restrictions. You may be asked to avoid all forms of caffeine for 24 hours before your procedure, or as told by your health care provider.  Ask your health care provider about changing or stopping your regular medicines. This is especially important if you are taking diabetes medicines or blood thinners.  If you use an inhaler, bring it with you to the test.  Wear loose, comfortable clothing and walking shoes.  Do notuse any products that contain nicotine or tobacco, such as cigarettes and e-cigarettes, for 4 hours before the test or as told by your health care provider. If you need help quitting, ask your health care provider. What happens during the procedure?  You will take off your clothes from the waist up and put on a hospital gown.  A technician will place electrodes on your  chest.  A blood pressure cuff will be placed on your arm.  You will lie down on a table for an ultrasound exam before you exercise. Gel will be rubbed on your chest, and a handheld device (transducer) will be pressed against your chest and moved over your  heart.  Then, you will start exercising by walking on a treadmill or pedaling a stationary bicycle.  Your blood pressure and heart rhythm will be monitored while you exercise.  The exercise will gradually get harder or faster.  You will exercise until: ? Your heart reaches a target level. ? You are too tired to continue. ? You cannot continue because of chest pain, weakness, or dizziness.  You will have another ultrasound exam after you stop exercising. The procedure may vary among health care providers and hospitals. What happens after the procedure?  Your heart rate and blood pressure will be monitored until they return to your normal levels. Summary  An exercise stress echocardiogram is a test that uses ultrasound to check how well your heart works before and after exercise.  Before the test, follow instructions from your health care provider about stopping medications, avoiding nicotine and tobacco, and avoiding certain foods and drinks.  During the test, your blood pressure and heart rhythm will be monitored while you exercise on a treadmill or stationary bicycle. This information is not intended to replace advice given to you by your health care provider. Make sure you discuss any questions you have with your health care provider. Document Released: 06/08/2004 Document Revised: 01/25/2016 Document Reviewed: 01/25/2016 Elsevier Interactive Patient Education  2018 [employer]

## 2017-04-24 NOTE — Addendum Note (Signed)
Addended by: Craige CottaANDERSON, ASHLEY S on: 04/24/2017 10:26 AM   Modules accepted: Orders

## 2017-04-24 NOTE — Progress Notes (Signed)
Cardiology Office Note:    Date:  04/24/2017   ID:  Anthony Carroll, DOB 05/02/1976, MRN [id number]  PCP:  Patient, No Pcp Per  Cardiologist:  Garwin Brothersajan R Ashni Lonzo, MD   Referring MD: No ref. provider found    ASSESSMENT:    1. Chest pain, unspecified type   2. Tobacco abuse    PLAN:    In order of problems listed above:  1. I discussed my findings with the patient had extensive length.  Primary prevention stressed with the patient.  Importance of compliance with diet and medication stressed and he vocalized understanding. 2. Blood pressure stable.  In view of his symptoms we will do an exercise stress echo.  This will help him be reassured 3. Patient will be seen in follow-up appointment in 6 months or earlier if the patient has any concerns. 4. I spent 5 minutes with the patient discussing solely about smoking. Smoking cessation was counseled. I suggested to the patient also different medications and pharmacological interventions. Patient is keen to try stopping on its own at this time. He will get back to me if he needs any further assistance in this matter.    Medication Adjustments/Labs and Tests Ordered: Current medicines are reviewed at length with the patient today.  Concerns regarding medicines are outlined above.  No orders of the defined types were placed in this encounter.  No orders of the defined types were placed in this encounter.    History of Present Illness:    Anthony Carroll is a 41 y.o. male who is being seen today for the evaluation of chest pain.  The patient is a pleasant gentleman with no significant past medical history.  He has been an active significant smoker since young age.  He went to the emergency room with chest pain.  He was doing some heavy physical work on that day.  He does not have any chest pain with exertion.  No radiation of the symptoms to the neck or to the arms.  At the time of my evaluation, the patient is alert awake oriented and in no  distress.  Past Medical History:  Diagnosis Date  . Eczema     Past Surgical History:  Procedure Laterality Date  . ANKLE SURGERY     left 1999    Current Medications: Current Meds  Medication Sig  . ibuprofen (ADVIL,MOTRIN) 200 MG tablet Take 800 mg by mouth every 6 (six) hours as needed for mild pain.  . methocarbamol (ROBAXIN) 500 MG tablet Take 1 tablet (500 mg total) by mouth 2 (two) times daily.  . Multiple Vitamins-Minerals (MULTIVITAMIN WITH MINERALS) tablet Take 1 tablet by mouth daily.     Allergies:   Patient has no known allergies.   Social History   Socioeconomic History  . Marital status: Married    Spouse name: None  . Number of children: None  . Years of education: None  . Highest education level: None  Social Needs  . Financial resource strain: None  . Food insecurity - worry: None  . Food insecurity - inability: None  . Transportation needs - medical: None  . Transportation needs - non-medical: None  Occupational History  . None  Tobacco Use  . Smoking status: Former Smoker    Packs/day: 1.00    Years: 6.00    Pack years: 6.00    Types: Cigarettes    Last attempt to quit: 04/17/2017    Years since quitting: 0.0  . Smokeless tobacco: Never  Used  Substance and Sexual Activity  . Alcohol use: Yes  . Drug use: No  . Sexual activity: None  Other Topics Concern  . None  Social History Narrative  . None     Family History: The patient's family history includes Diabetes in his father; Hypertension in his other.  ROS:   Please see the history of present illness.    All other systems reviewed and are negative.  EKGs/Labs/Other Studies Reviewed:    The following studies were reviewed today: I reviewed records from the emergency room with the patient had extensive length and questions were answered to his satisfaction.  I reviewed his EKG also.   Recent Labs: 04/17/2017: BUN 12; Creatinine, Ser 0.99; Hemoglobin 15.7; Platelets 184;  Potassium 4.3; Sodium 137  Recent Lipid Panel No results found for: CHOL, TRIG, HDL, CHOLHDL, VLDL, LDLCALC, LDLDIRECT  Physical Exam:    VS:  BP 136/82   Pulse 79   Ht 5\' 9"  (1.753 m)   Wt 224 lb 12.8 oz (102 kg)   SpO2 98%   BMI 33.20 kg/m     Wt Readings from Last 3 Encounters:  04/24/17 224 lb 12.8 oz (102 kg)  12/21/15 192 lb (87.1 kg)     GEN: Patient is in no acute distress HEENT: Normal NECK: No JVD; No carotid bruits LYMPHATICS: No lymphadenopathy CARDIAC: S1 S2 regular, 2/6 systolic murmur at the apex. RESPIRATORY:  Clear to auscultation without rales, wheezing or rhonchi  ABDOMEN: Soft, non-tender, non-distended MUSCULOSKELETAL:  No edema; No deformity  SKIN: Warm and dry NEUROLOGIC:  Alert and oriented x 3 PSYCHIATRIC:  Normal affect    Signed, Garwin Brothersajan R Tamatha Gadbois, MD  04/24/2017 10:08 AM    [facility]

## 2017-04-24 NOTE — Addendum Note (Signed)
Addended by: Craige CottaANDERSON, ASHLEY S on: 04/24/2017 10:17 AM   Modules accepted: Orders

## 2017-08-06 ENCOUNTER — Ambulatory Visit (HOSPITAL_BASED_OUTPATIENT_CLINIC_OR_DEPARTMENT_OTHER): Payer: 59

## 2017-08-06 ENCOUNTER — Ambulatory Visit: Payer: Self-pay | Admitting: Allergy and Immunology

## 2017-09-02 ENCOUNTER — Ambulatory Visit (INDEPENDENT_AMBULATORY_CARE_PROVIDER_SITE_OTHER): Payer: 59 | Admitting: Allergy and Immunology

## 2017-09-02 ENCOUNTER — Encounter: Payer: Self-pay | Admitting: Allergy and Immunology

## 2017-09-02 VITALS — BP 132/78 | HR 84 | Temp 98.2°F | Resp 18 | Ht 69.0 in | Wt 230.8 lb

## 2017-09-02 DIAGNOSIS — H1010 Acute atopic conjunctivitis, unspecified eye: Secondary | ICD-10-CM | POA: Insufficient documentation

## 2017-09-02 DIAGNOSIS — L2089 Other atopic dermatitis: Secondary | ICD-10-CM | POA: Diagnosis not present

## 2017-09-02 DIAGNOSIS — T781XXA Other adverse food reactions, not elsewhere classified, initial encounter: Secondary | ICD-10-CM

## 2017-09-02 DIAGNOSIS — L209 Atopic dermatitis, unspecified: Secondary | ICD-10-CM | POA: Insufficient documentation

## 2017-09-02 DIAGNOSIS — J3089 Other allergic rhinitis: Secondary | ICD-10-CM | POA: Diagnosis not present

## 2017-09-02 DIAGNOSIS — H1013 Acute atopic conjunctivitis, bilateral: Secondary | ICD-10-CM | POA: Diagnosis not present

## 2017-09-02 MED ORDER — FLUTICASONE PROPIONATE 93 MCG/ACT NA EXHU: 2.0000 | INHALANT_SUSPENSION | Freq: Two times a day (BID) | NASAL | 3 refills | Status: DC

## 2017-09-02 MED ORDER — LEVOCETIRIZINE DIHYDROCHLORIDE 5 MG PO TABS
5.0000 mg | ORAL_TABLET | Freq: Every evening | ORAL | 5 refills | Status: DC
Start: 2017-09-02 — End: 2018-08-20

## 2017-09-02 MED ORDER — MOMETASONE FUROATE 0.1 % EX OINT: TOPICAL_OINTMENT | Freq: Every day | CUTANEOUS | 5 refills | Status: DC

## 2017-09-02 MED ORDER — OLOPATADINE HCL 0.7 % OP SOLN: 1.0000 [drp] | OPHTHALMIC | 5 refills | Status: DC

## 2017-09-02 MED ORDER — DESONIDE 0.05 % EX OINT: 1.0000 "application " | TOPICAL_OINTMENT | Freq: Two times a day (BID) | CUTANEOUS | 5 refills | Status: DC

## 2017-09-02 NOTE — Assessment & Plan Note (Signed)
   Treatment plan as outlined above for allergic rhinitis.  A prescription has been provided for Pazeo, one drop per eye daily as needed.  I have also recommended eye lubricant drops (i.e., Natural Tears) as needed. 

## 2017-09-02 NOTE — Progress Notes (Signed)
New Patient Note  RE: Anthony Carroll MRN: [id number] DOB: Feb 25, 1976 Date of Office Visit: 09/02/2017  Referring provider: No ref. provider found Primary care provider: Patient, No Pcp Per  Chief Complaint: Eczema; Allergic Rhinitis ; and Food Intolerance   History of present illness: Anthony Carroll is a 42 y.o. male presenting today for evaluation of rhinitis and eczema.  He reports that he has had eczema since early infancy.  The eczema primarily involves his fingers, hands, wrists, neck, back, antecubital fossae, elbows, and popliteal fossae.  The eczema seems to be most severe during the springtime.  No specific food or other environmental triggers have been identified which seem to correlate with eczema flares.  Triamcinolone 0.1% cream has failed to adequately suppress the eczema.  He reports that he scratches throughout the night. Anthony Carroll also complains of nasal congestion, rhinorrhea, sneezing, nasal pruritus, ocular pruritus, lacrimation, periorbital edema, and sinus pressure.  These symptoms occur year around but are most frequent and severe during the springtime.  He attempts to control these symptoms, without adequate relief, with Advil and Zyrtec.  Try notes that when he consumes raw apples or peaches he experiences oral pharyngeal pruritus.  He does not experience concomitant urticaria, angioedema, cardiopulmonary symptoms, or other GI symptoms.  He is able to consume both of these fruits without symptoms if they have been cooked, such as in apple or peach pie.   Assessment and plan: Seasonal and perennial allergic rhinitis  Aeroallergen avoidance measures have been discussed and provided in written form.  A prescription has been provided for levocetirizine, 5 mg daily as needed.  A prescription has been provided for [facility], 2 actuations per nostril twice a day. Proper technique has been discussed and demonstrated.  Nasal saline spray (i.e., Simply Saline) or nasal saline lavage  (i.e., NeilMed) is recommended as needed and prior to medicated nasal sprays.  The risks and benefits of aeroallergen immunotherapy have been discussed. The patient is interested in the possibility of initiating immunotherapy if insurance coverage is favorable. He will let us know how he would like to proceed.  Allergic conjunctivitis  Treatment plan as outlined above for allergic rhinitis.  A prescription has been provided for Pazeo, one drop per eye daily as needed.  I have also recommended eye lubricant drops (i.e., Natural Tears) as needed.  Oral allergy syndrome Oral allergy syndrome.  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 Peeling or cooking the food has shown to reduce symptoms and antihistamines may also relieve symptoms. Immunotherapy to the cross reacting pollens has improved or cured OAS in many patients, though this has not been consistent for all patients. Typically OAS is limited to itching or swelling of mucosal tissues from the lips to the back of the throat.   Information about OAS has been discussed and provided in written form.  All foods causing symptoms are to be avoided.  Should symptoms progress beyond the mouth and throat, 911 is to be called immediately.  Atopic dermatitis  Appropriate skin care recommendations have been provided verbally and in written form.  A prescription has been provided for desonide 0.05% ointment sparingly to affected areas twice daily as needed to the face and/or neck. Care is to be taken to avoid the eyes.  A prescription has been provided for mometasone 0.1% ointment sparingly to affected areas daily as needed below the face and neck. Care is to be taken to avoid the axillae and groin area.  The patient has been asked to make note of any foods that trigger symptom flares.  Fingernails are to be kept trimmed.  Information has been provided regarding the dupilumab (Dupixent)  therapy.  Information has been provided regarding CLn BodyWash to reduce staph aureus colonization.  CLn BodyWash is ordered online however, if it is too expensive, information and instructions for diluted bleach baths have also been provided.   Meds ordered this encounter  Medications  . Fluticasone Propionate (XHANCE) 93 MCG/ACT EXHU    Sig: Place 2 sprays into the nose 2 (two) times daily.    Dispense:  16 mL    Refill:  3  . levocetirizine (XYZAL) 5 MG tablet    Sig: Take 1 tablet (5 mg total) by mouth every evening.    Dispense:  30 tablet    Refill:  5  . Olopatadine HCl (PAZEO) 0.7 % SOLN    Sig: Place 1 drop into both eyes 1 day or 1 dose.    Dispense:  1 Bottle    Refill:  5  . desonide (DESOWEN) 0.05 % ointment    Sig: Apply 1 application topically 2 (two) times daily.    Dispense:  15 g    Refill:  5  . mometasone (ELOCON) 0.1 % ointment    Sig: Apply topically daily.    Dispense:  45 g    Refill:  5    Diagnostics: Epicutaneous testing: Positive to grass pollen, weed pollen, ragweed pollen, tree pollen, molds, cat hair, cockroach antigen, and dust mite antigen. Intradermal testing: Positive to dog antigen. Food allergen skin testing: Negative.     Physical examination: Blood pressure 132/78, pulse 84, temperature 98.2 F (36.8 C), resp. rate 18, height 5\' 9"  (1.753 m), weight 230 lb 12.8 oz (104.7 kg), SpO2 97 %.  General: Alert, interactive, in no acute distress. HEENT: TMs pearly gray, turbinates edematous without discharge, post-pharynx moderately erythematous. Neck: Supple without lymphadenopathy. Lungs: Clear to auscultation without wheezing, rhonchi or rales. CV: Normal S1, S2 without murmurs. Abdomen: Nondistended, nontender. Skin: Dry, hyperpigmented, thickened patches on the hands, wrists, and fingers. Extremities:  No clubbing, cyanosis or edema. Neuro:   Grossly intact.  Review of systems:  Review of systems negative except as noted in HPI /  PMHx or noted below: Review of Systems  Constitutional: Negative.   HENT: Negative.   Eyes: Negative.   Respiratory: Negative.   Cardiovascular: Negative.   Gastrointestinal: Negative.   Genitourinary: Negative.   Musculoskeletal: Negative.   Skin: Negative.   Neurological: Negative.   Endo/Heme/Allergies: Negative.   Psychiatric/Behavioral: Negative.     Past medical history:  Past Medical History:  Diagnosis Date  . Eczema     Past surgical history:  Past Surgical History:  Procedure Laterality Date  . ANKLE SURGERY     left 1999    Family history: Family History  Problem Relation Age of Onset  . Diabetes Father   . Hypertension Other     Social history: Social History   Socioeconomic History  . Marital status: Married    Spouse name: Not on file  . Number of children: Not on file  . Years of education: Not on file  . Highest education level: Not on file  Social Needs  . Financial resource strain: Not on file  . Food insecurity - worry: Not on file  . Food insecurity - inability: Not on file  . Transportation needs - medical: Not on file  . Transportation needs -  non-medical: Not on file  Occupational History  . Not on file  Tobacco Use  . Smoking status: Former Smoker    Packs/day: 1.00    Years: 6.00    Pack years: 6.00    Types: Cigarettes    Last attempt to quit: 04/17/2017    Years since quitting: 0.3  . Smokeless tobacco: Never Used  Substance and Sexual Activity  . Alcohol use: Yes  . Drug use: No  . Sexual activity: Not on file  Other Topics Concern  . Not on file  Social History Narrative  . Not on file   Environmental History: The patient lives in an 43 year old house with carpeting throughout and central air/heat.  There is no known mold/water damage in the home.  He is a cigarette smoker, half pack per day.  Allergies as of 09/02/2017   No Known Allergies     Medication List        Accurate as of 09/02/17  5:34 PM. Always use  your most recent med list.          desonide 0.05 % ointment Commonly known as:  DESOWEN Apply 1 application topically 2 (two) times daily.   Fluticasone Propionate 93 MCG/ACT Exhu Commonly known as:  XHANCE Place 2 sprays into the nose 2 (two) times daily.   ibuprofen 200 MG tablet Commonly known as:  ADVIL,MOTRIN Take 800 mg by mouth every 6 (six) hours as needed for mild pain.   levocetirizine 5 MG tablet Commonly known as:  XYZAL Take 1 tablet (5 mg total) by mouth every evening.   mometasone 0.1 % ointment Commonly known as:  ELOCON Apply topically daily.   multivitamin with minerals tablet Take 1 tablet by mouth daily.   Olopatadine HCl 0.7 % Soln Commonly known as:  PAZEO Place 1 drop into both eyes 1 day or 1 dose.   triamcinolone cream 0.1 % Commonly known as:  KENALOG       Known medication allergies: No Known Allergies  I appreciate the opportunity to take part in Anthony Carroll's care. Please do not hesitate to contact me with questions.  Sincerely,   R. Jorene Guestarter Avner Stroder, MD

## 2017-09-02 NOTE — Assessment & Plan Note (Signed)
Oral allergy syndrome.  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 Peeling or cooking the food has shown to reduce symptoms and antihistamines may also relieve symptoms. Immunotherapy to the cross reacting pollens has improved or cured OAS in many patients, though this has not been consistent for all patients. Typically OAS is limited to itching or swelling of mucosal tissues from the lips to the back of the throat.   Information about OAS has been discussed and provided in written form.  All foods causing symptoms are to be avoided.  Should symptoms progress beyond the mouth and throat, 911 is to be called immediately. 

## 2017-09-02 NOTE — Assessment & Plan Note (Signed)
   Appropriate skin care recommendations have been provided verbally and in written form.  A prescription has been provided for desonide 0.05% ointment sparingly to affected areas twice daily as needed to the face and/or neck. Care is to be taken to avoid the eyes.  A prescription has been provided for mometasone 0.1% ointment sparingly to affected areas daily as needed below the face and neck. Care is to be taken to avoid the axillae and groin area.  The patient has been asked to make note of any foods that trigger symptom flares.  Fingernails are to be kept trimmed.  Information has been provided regarding the dupilumab (Dupixent) therapy.  Information has been provided regarding CLn BodyWash to reduce staph aureus colonization.  CLn BodyWash is ordered online however, if it is too expensive, information and instructions for diluted bleach baths have also been provided.

## 2017-09-02 NOTE — Patient Instructions (Addendum)
Seasonal and perennial allergic rhinitis  Aeroallergen avoidance measures have been discussed and provided in written form.  A prescription has been provided for levocetirizine, 5 mg daily as needed.  A prescription has been provided for [facility], 2 actuations per nostril twice a day. Proper technique has been discussed and demonstrated.  Nasal saline spray (i.e., Simply Saline) or nasal saline lavage (i.e., NeilMed) is recommended as needed and prior to medicated nasal sprays.  The risks and benefits of aeroallergen immunotherapy have been discussed. The patient is interested in the possibility of initiating immunotherapy if insurance coverage is favorable. He will let us know how he would like to proceed.  Allergic conjunctivitis  Treatment plan as outlined above for allergic rhinitis.  A prescription has been provided for Pazeo, one drop per eye daily as needed.  I have also recommended eye lubricant drops (i.e., Natural Tears) as needed.  Oral allergy syndrome Oral allergy syndrome.  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 Peeling or cooking the food has shown to reduce symptoms and antihistamines may also relieve symptoms. Immunotherapy to the cross reacting pollens has improved or cured OAS in many patients, though this has not been consistent for all patients. Typically OAS is limited to itching or swelling of mucosal tissues from the lips to the back of the throat.   Information about OAS has been discussed and provided in written form.  All foods causing symptoms are to be avoided.  Should symptoms progress beyond the mouth and throat, 911 is to be called immediately.  Atopic dermatitis  Appropriate skin care recommendations have been provided verbally and in written form.  A prescription has been provided for desonide 0.05% ointment sparingly to affected areas twice daily as needed to the face and/or neck. Care is to be  taken to avoid the eyes.  A prescription has been provided for mometasone 0.1% ointment sparingly to affected areas daily as needed below the face and neck. Care is to be taken to avoid the axillae and groin area.  The patient has been asked to make note of any foods that trigger symptom flares.  Fingernails are to be kept trimmed.  Information has been provided regarding the dupilumab (Dupixent) therapy.  Information has been provided regarding CLn BodyWash to reduce staph aureus colonization.  CLn BodyWash is ordered online however, if it is too expensive, information and instructions for diluted bleach baths have also been provided.   Return in about 3 months (around 12/03/2017), or if symptoms worsen or fail to improve.  ECZEMA SKIN CARE REGIMEN:  Bathed and soak for 10 minutes in warm water once today. Pat dry.  Immediately apply the below creams: To healthy skin apply Aquaphor or Vaseline jelly twice a day. To affected areas on the face and neck, apply: . Desonide 0.05% ointment twice a day as needed. . Be careful to avoid the eyes. To affected areas on the body (below the face and neck), apply: . Mometasone 0.1% ointment once a day as needed. . With ointments be careful to avoid the armpits and groin area. Note of any foods make the eczema worse. Keep finger nails trimmed and filed. Information has been provided regarding Dupixent.  CLn BodyWash may be ordered online at [email or web address]  If CLn BodyWash is too expensive, may try diluted bleach baths...  Diluted bleach bath recipe and instructions:   Add  -  cup of common household bleach to a bathtub full of water.  Soak the affected part of the body (below the head and neck) for about 10 minutes.  Limit diluted bleach baths to no more than twice a week.   Do not submerge the head or face and be very careful to avoid getting the diluted bleach into the eyes.   Rinse off with fresh water and apply  moisturizer.  Reducing Pollen Exposure  The American Academy of Allergy, Asthma and Immunology suggests the following steps to reduce your exposure to pollen during allergy seasons.    1. Do not hang sheets or clothing out to dry; pollen may collect on these items. 2. Do not mow lawns or spend time around freshly cut grass; mowing stirs up pollen. 3. Keep windows closed at night.  Keep car windows closed while driving. 4. Minimize morning activities outdoors, a time when pollen counts are usually at their highest. 5. Stay indoors as much as possible when pollen counts or humidity is high and on windy days when pollen tends to remain in the air longer. 6. Use air conditioning when possible.  Many air conditioners have filters that trap the pollen spores. 7. Use a HEPA room air filter to remove pollen form the indoor air you breathe.   Control of House Dust Mite Allergen  House dust mites play a major role in allergic asthma and rhinitis.  They occur in environments with high humidity wherever human skin, the food for dust mites is found. High levels have been detected in dust obtained from mattresses, pillows, carpets, upholstered furniture, bed covers, clothes and soft toys.  The principal allergen of the house dust mite is found in its feces.  A gram of dust may contain 1,000 mites and 250,000 fecal particles.  Mite antigen is easily measured in the air during house cleaning activities.    1. Encase mattresses, including the box spring, and pillow, in an air tight cover.  Seal the zipper end of the encased mattresses with wide adhesive tape. 2. Wash the bedding in water of 130 degrees Farenheit weekly.  Avoid cotton comforters/quilts and flannel bedding: the most ideal bed covering is the dacron comforter. 3. Remove all upholstered furniture from the bedroom. 4. Remove carpets, carpet padding, rugs, and non-washable window drapes from the bedroom.  Wash drapes weekly or use plastic window  coverings. 5. Remove all non-washable stuffed toys from the bedroom.  Wash stuffed toys weekly. 6. Have the room cleaned frequently with a vacuum cleaner and a damp dust-mop.  The patient should not be in a room which is being cleaned and should wait 1 hour after cleaning before going into the room. 7. Close and seal all heating outlets in the bedroom.  Otherwise, the room will become filled with dust-laden air.  An electric heater can be used to heat the room. Reduce indoor humidity to less than 50%.  Do not use a humidifier.  Control of Dog or Cat Allergen  Avoidance is the best way to manage a dog or cat allergy. If you have a dog or cat and are allergic to dog or cats, consider removing the dog or cat from the home. If you have a dog or cat but don't want to find it a new home, or if your family wants a pet even though someone in the household is allergic, here are some strategies that may help keep symptoms at bay:  1. Keep the pet out of your bedroom and restrict it to only a few rooms. Be advised that keeping  the dog or cat in only one room will not limit the allergens to that room. 2. Don't pet, hug or kiss the dog or cat; if you do, wash your hands with soap and water. 3. High-efficiency particulate air (HEPA) cleaners run continuously in a bedroom or living room can reduce allergen levels over time. 4. Place electrostatic material sheet in the air inlet vent in the bedroom. 5. Regular use of a high-efficiency vacuum cleaner or a central vacuum can reduce allergen levels. 6. Giving your dog or cat a bath at least once a week can reduce airborne allergen.  Control of Mold Allergen  Mold and fungi can grow on a variety of surfaces provided certain temperature and moisture conditions exist.  Outdoor molds grow on plants, decaying vegetation and soil.  The major outdoor mold, Alternaria and Cladosporium, are found in very high numbers during hot and dry conditions.  Generally, a late Summer  - Fall peak is seen for common outdoor fungal spores.  Rain will temporarily lower outdoor mold spore count, but counts rise rapidly when the rainy period ends.  The most important indoor molds are Aspergillus and Penicillium.  Dark, humid and poorly ventilated basements are ideal sites for mold growth.  The next most common sites of mold growth are the bathroom and the kitchen.  Outdoor [employer] 1. Use air conditioning and keep windows closed 2. Avoid exposure to decaying vegetation. 3. Avoid leaf raking. 4. Avoid grain handling. 5. Consider wearing a face mask if working in moldy areas.  Indoor Mold Control 6. Maintain humidity below 50%. 7. Clean washable surfaces with 5% bleach solution. 8. Remove sources e.g. Contaminated carpets.  Control of Cockroach Allergen  Cockroach allergen has been identified as an important cause of acute attacks of asthma, especially in urban settings.  There are fifty-five species of cockroach that exist in the [place], however only three, the [place], [place] species produce allergen that can affect patients with Asthma.  Allergens can be obtained from fecal particles, egg casings and secretions from cockroaches.    1. Remove food sources. 2. Reduce access to water. 3. Seal access and entry points. 4. Spray runways with 0.5-1% Diazinon or Chlorpyrifos 5. Blow boric acid power under stoves and refrigerator. 6. Place bait stations (hydramethylnon) at feeding sites.  Oral Allergy Syndrome (OAS)  Oral Allergy Syndrome or OAS is an allergic reaction to certain (usually fresh) fruits, nuts, and vegetables. The allergy is not actually an allergy to food but a syndrome that develops in pollen allergy sufferers. The immune system mistakes the food proteins for the pollen proteins and causes an allergic reaction. For instance, an allergy to ragweed is associated with OAS reactions to banana, watermelon, cantaloupe, honeydew, zucchini, and  cucumber. This does not mean that all sufferers of an allergy to ragweed will experience adverse effects from all or even any of these foods. Reactions may begin with one type of food and with reactions to others developing later. However, reaction to one or more foods in any given category does not necessarily mean a person is allergic to all foods in that group. OAS sufferers may have a number of reactions that usually occur very rapidly, within minutes of eating a trigger food. The most common reaction is an itching or burning sensation in the lips, mouth, and/or pharynx. Sometimes other reactions can be triggered in the eyes, nose, and skin. The most severe reactions can result in asthma problems or anaphylaxis.  If a  sufferer is able to swallow the food, there is a good chance that there will be a reaction later in the gastrointestinal tract. Vomiting, diarrhea, severe indigestion, or cramps may occur.  Treatment: An OAS sufferer should avoid foods to which they are allergic. Peeling or cooking the food has shown to reduce symptoms in the throat and mouth, but may not relieve symptoms in the gastrointestinal tract. Antihistamines may also relieve the symptoms of the allergy. Persons with severe reactions may consider carrying injectable epinephrine should systemic symptoms occur. Allergy immunotherapy to the pollens has improved or cured OAS in many patients, though this has not been consistent for all patients. Laban Emperor pollen: almonds, apples, celery, cherries, hazel nuts, peaches, pears, parsley, strawberry, raspberry . Birch pollen: almonds, apples, apricots, avocados, bananas, carrots, celery, cherries, chicory, coriander, fennel, fig, hazel nuts, kiwifruit, nectarines, parsley, parsnips, peaches, pears, peppers, plums, potatoes, prunes, soy, strawberries, wheat; Potential: walnuts . Grass pollen: fig, melons, tomatoes, oranges . Mugwort pollen : carrots, celery, coriander, fennel, parsley, peppers,  sunflower . Ragweed pollen : banana, cantaloupe, cucumber, green pepper, paprika, sunflower seeds/oil, honeydew, watermelon, zucchini, echinacea, artichoke, dandelions, honey (if bees pollinate from wild flowers), hibiscus or chamomile tea . Possible cross-reactions (to any of the above): berries (strawberries, blueberries, raspberries, etc), citrus (oranges, lemons, etc), grapes, mango, figs, peanut, pineapple, pomegranates, watermelon

## 2017-09-02 NOTE — Assessment & Plan Note (Addendum)
   Aeroallergen avoidance measures have been discussed and provided in written form.  A prescription has been provided for levocetirizine, 5 mg daily as needed.  A prescription has been provided for Xhance, 2 actuations per nostril twice a day. Proper technique has been discussed and demonstrated.  Nasal saline spray (i.e., Simply Saline) or nasal saline lavage (i.e., NeilMed) is recommended as needed and prior to medicated nasal sprays.  The risks and benefits of aeroallergen immunotherapy have been discussed. The patient is interested in the possibility of initiating immunotherapy if insurance coverage is favorable. He will let us know how he would like to proceed. 

## 2017-09-03 ENCOUNTER — Ambulatory Visit (HOSPITAL_BASED_OUTPATIENT_CLINIC_OR_DEPARTMENT_OTHER): Payer: 59 | Attending: Cardiology

## 2017-10-23 ENCOUNTER — Telehealth: Payer: Self-pay

## 2017-10-23 MED ORDER — AZELASTINE HCL 0.05 % OP SOLN: 1.0000 [drp] | Freq: Two times a day (BID) | OPHTHALMIC | 5 refills | Status: DC | PRN

## 2017-10-23 NOTE — Telephone Encounter (Addendum)
Azelastine eye drops called in to [facility] x 1 with 5 refills

## 2017-10-23 NOTE — Telephone Encounter (Signed)
Azelastine eye drops, 1 drop ou bid prn

## 2017-10-23 NOTE — Addendum Note (Signed)
Addended by: Virl Son D on: 10/23/2017 02:39 PM   Modules accepted: Orders

## 2017-10-23 NOTE — Telephone Encounter (Signed)
Pazeo denied by insurance, will cover azelastine ophthalmic. Please advise

## 2017-10-25 MED ORDER — AZELASTINE HCL 0.05 % OP SOLN: 1.0000 [drp] | Freq: Two times a day (BID) | OPHTHALMIC | 5 refills | Status: DC | PRN

## 2017-10-25 NOTE — Addendum Note (Signed)
Addended by: Virl Son D on: 10/25/2017 10:16 AM   Modules accepted: Orders

## 2017-11-14 ENCOUNTER — Emergency Department (HOSPITAL_COMMUNITY): Payer: 59

## 2017-11-14 ENCOUNTER — Other Ambulatory Visit: Payer: Self-pay

## 2017-11-14 ENCOUNTER — Emergency Department (HOSPITAL_COMMUNITY)
Admission: EM | Admit: 2017-11-14 | Discharge: 2017-11-14 | Disposition: A | Discharge status: Home / Self Care | Payer: 59 | Attending: Emergency Medicine | Admitting: Emergency Medicine

## 2017-11-14 ENCOUNTER — Encounter (HOSPITAL_COMMUNITY): Payer: Self-pay

## 2017-11-14 DIAGNOSIS — Z5321 Procedure and treatment not carried out due to patient leaving prior to being seen by health care provider: Secondary | ICD-10-CM | POA: Insufficient documentation

## 2017-11-14 DIAGNOSIS — R079 Chest pain, unspecified: Secondary | ICD-10-CM | POA: Diagnosis present

## 2017-11-14 LAB — BASIC METABOLIC PANEL
ANION GAP: 11 (ref 5–15)
BUN: 11 mg/dL (ref 6–20)
CHLORIDE: 104 mmol/L (ref 101–111)
CO2: 24 mmol/L (ref 22–32)
Calcium: 9.2 mg/dL (ref 8.9–10.3)
Creatinine, Ser: 1.19 mg/dL (ref 0.61–1.24)
GFR calc non Af Amer: >60 mL/min (ref >60)
Glucose, Bld: 193 mg/dL — ABNORMAL HIGH (ref 65–99)
POTASSIUM: 3.6 mmol/L (ref 3.5–5.1)
SODIUM: 139 mmol/L (ref 135–145)

## 2017-11-14 LAB — CBC
HEMATOCRIT: 45.7 % (ref 39.0–52.0)
HEMOGLOBIN: 15.3 g/dL (ref 13.0–17.0)
MCH: 29.8 pg (ref 26.0–34.0)
MCHC: 33.5 g/dL (ref 30.0–36.0)
MCV: 88.9 fL (ref 78.0–100.0)
Platelets: 204 10*3/uL (ref 150–400)
RBC: 5.14 MIL/uL (ref 4.22–5.81)
RDW: 14.9 % (ref 11.5–15.5)
WBC: 8.7 10*3/uL (ref 4.0–10.5)

## 2017-11-14 LAB — I-STAT TROPONIN, ED: Troponin i, poc: 0 ng/mL (ref 0.00–0.08)

## 2017-11-14 MED ORDER — ALBUTEROL SULFATE (2.5 MG/3ML) 0.083% IN NEBU
5.0000 mg | INHALATION_SOLUTION | Freq: Once | RESPIRATORY_TRACT | Status: AC
  Administered 2017-11-14: 5 mg via RESPIRATORY_TRACT
  Filled 2017-11-14: qty 6

## 2017-11-14 NOTE — ED Notes (Signed)
Called Pt in lobby for vital recheck, no response in lobby x1. 

## 2017-11-14 NOTE — ED Triage Notes (Signed)
Pt c/o centralized chest tightness and shortness of breath starting this morning while at work.

## 2017-11-14 NOTE — ED Notes (Signed)
Called Pt in lobby for vital recheck, no response in lobby x3. 

## 2017-11-14 NOTE — ED Notes (Signed)
Called Pt in lobby for vital recheck, no response in lobby x2. 

## 2017-12-03 ENCOUNTER — Ambulatory Visit: Payer: 59 | Admitting: Allergy and Immunology

## 2017-12-03 DIAGNOSIS — J309 Allergic rhinitis, unspecified: Secondary | ICD-10-CM

## 2017-12-07 ENCOUNTER — Encounter (HOSPITAL_COMMUNITY): Payer: Self-pay

## 2017-12-07 ENCOUNTER — Ambulatory Visit (HOSPITAL_COMMUNITY)
Admission: EM | Admit: 2017-12-07 | Discharge: 2017-12-07 | Disposition: A | Discharge status: Home / Self Care | Payer: 59 | Attending: Internal Medicine | Admitting: Internal Medicine

## 2017-12-07 DIAGNOSIS — L309 Dermatitis, unspecified: Secondary | ICD-10-CM

## 2017-12-07 MED ORDER — METHYLPREDNISOLONE ACETATE 80 MG/ML IJ SUSP
80.0000 mg | Freq: Once | INTRAMUSCULAR | Status: AC
  Administered 2017-12-07: 80 mg via INTRAMUSCULAR

## 2017-12-07 MED ORDER — PREDNISONE 10 MG (21) PO TBPK: ORAL_TABLET | Freq: Every day | ORAL | 0 refills | Status: DC

## 2017-12-07 MED ORDER — CRISABOROLE 2 % EX OINT: 1.0000 "application " | TOPICAL_OINTMENT | Freq: Two times a day (BID) | CUTANEOUS | 0 refills | Status: AC

## 2017-12-07 MED ORDER — METHYLPREDNISOLONE ACETATE 80 MG/ML IJ SUSP
INTRAMUSCULAR | Status: AC
Start: 2017-12-07 — End: ?
  Filled 2017-12-07: qty 1

## 2017-12-07 NOTE — ED Triage Notes (Signed)
Pt presents with rash on hands. Was prescribed cream that is not working.

## 2017-12-07 NOTE — ED Provider Notes (Signed)
[facility]    CSN: [id number] Arrival date & time: 12/07/17  1314     History   Chief Complaint No chief complaint on file.   HPI Anthony Carroll is a 42 y.o. male.   Subjective:   Anthony Carroll is a 42 y.o. male who presents for evaluation of rash. Rash started several weeks ago. Initial distribution: bilateral hand. Lesions are thick and are of raised texture. Rash has worsened over time.  Rash is painful and is pruritic. Patient denies any associated symptoms. Patient has not had contacts with similar rash. Patient has not had new exposures.Patient has a history of eczema. He has used his prescribed mometasone cream with minimal relief in the rash.  The following portions of the patient's history were reviewed and updated as appropriate: allergies, current medications, past family history, past medical history, past social history, past surgical history and problem list.        Past Medical History:  Diagnosis Date  . Eczema     Patient Active Problem List   Diagnosis Date Noted  . Seasonal and perennial allergic rhinitis 09/02/2017  . Allergic conjunctivitis 09/02/2017  . Oral allergy syndrome 09/02/2017  . Atopic dermatitis 09/02/2017  . Chest pain, unspecified 04/24/2017  . Tobacco abuse 04/24/2017    Past Surgical History:  Procedure Laterality Date  . ANKLE SURGERY     left 1999       Home Medications    Prior to Admission medications   Medication Sig Start Date End Date Taking? Authorizing Provider  azelastine (OPTIVAR) 0.05 % ophthalmic solution Place 1 drop into both eyes 2 (two) times daily as needed. 10/25/17   Bobbitt, Heywood Ilesalph Carter, MD  desonide (DESOWEN) 0.05 % ointment Apply 1 application topically 2 (two) times daily. 09/02/17   Bobbitt, Heywood Ilesalph Carter, MD  Fluticasone Propionate Timmothy Sours(XHANCE) 93 MCG/ACT EXHU Place 2 sprays into the nose 2 (two) times daily. 09/02/17   Bobbitt, Heywood Ilesalph Carter, MD  ibuprofen (ADVIL,MOTRIN) 200 MG tablet Take 800 mg  by mouth every 6 (six) hours as needed for mild pain.    [provider]  levocetirizine (XYZAL) 5 MG tablet Take 1 tablet (5 mg total) by mouth every evening. 09/02/17   Bobbitt, Heywood Ilesalph Carter, MD  mometasone (ELOCON) 0.1 % ointment Apply topically daily. 09/02/17   Bobbitt, Heywood Ilesalph Carter, MD  Multiple Vitamins-Minerals (MULTIVITAMIN WITH MINERALS) tablet Take 1 tablet by mouth daily.    [provider]  Olopatadine HCl (PAZEO) 0.7 % SOLN Place 1 drop into both eyes 1 day or 1 dose. 09/02/17   Bobbitt, Heywood Ilesalph Carter, MD  triamcinolone cream (KENALOG) 0.1 %  06/13/17   [provider]    Family History Family History  Problem Relation Age of Onset  . Diabetes Father   . Hypertension Other     Social History Social History   Tobacco Use  . Smoking status: Former Smoker    Packs/day: 1.00    Years: 6.00    Pack years: 6.00    Types: Cigarettes    Last attempt to quit: 04/17/2017    Years since quitting: 0.6  . Smokeless tobacco: Never Used  Substance Use Topics  . Alcohol use: Yes  . Drug use: No     Allergies   Patient has no known allergies.   Review of Systems Review of Systems  Constitutional: Negative for fever.  Gastrointestinal: Negative for nausea and vomiting.  Skin: Positive for rash.  All other systems reviewed and are negative.  Physical Exam Triage Vital Signs ED Triage Vitals  Enc Vitals Group     BP 12/07/17 1417 136/87     Pulse Rate 12/07/17 1417 84     Resp 12/07/17 1417 16     Temp 12/07/17 1417 98.7 F (37.1 C)     Temp Source 12/07/17 1417 Oral     SpO2 12/07/17 1417 99 %     Weight --      Height --      Head Circumference --      Peak Flow --      Pain Score 12/07/17 1420 0     Pain Loc --      Pain Edu? --      Excl. in GC? --    No data found.  Updated Vital Signs BP 136/87 (BP Location: Left Arm)   Pulse 84   Temp 98.7 F (37.1 C) (Oral)   Resp 16   SpO2 99%   Visual Acuity Right Eye Distance:    Left Eye Distance:   Bilateral Distance:    Right Eye Near:   Left Eye Near:    Bilateral Near:     Physical Exam  Constitutional: He is oriented to person, place, and time. He appears well-developed and well-nourished.  Neck: Normal range of motion.  Cardiovascular: Normal rate and regular rhythm.  Pulmonary/Chest: Effort normal and breath sounds normal.  Musculoskeletal: Normal range of motion.  Neurological: He is alert and oriented to person, place, and time.  Skin: Skin is warm and dry. Rash noted.  Erythematous papules and vesicles with exudation and crusting noted to bilateral hands      UC Treatments / Results  Labs (all labs ordered are listed, but only abnormal results are displayed) Labs Reviewed - No data to display  EKG None  Radiology No results found.  Procedures Procedures (including critical care time)  Medications Ordered in UC Medications - No data to display  Initial Impression / Assessment and Plan / UC Course  I have reviewed the triage vital signs and the nursing notes.  Pertinent labs & imaging results that were available during my care of the patient were reviewed by me and considered in my medical decision making (see chart for details).     42 year old male with history of eczema presenting with worsening rash to his hands consistent with his typical eczema flare.  He has used his prescribed mometasone cream with minimal relief.  Patient will be treated for an acute exacerbation of his chronic atopic dermatitis.   Plan: - Solu-Medrol injection given in clinic - Short course of prednisone prescribed - Eucrisa ointment twice daily - Benadryl prn for itching. - Observe for signs of superimposed infection and systemic - symptoms. - Referral to Dermatology if unimproved. - Watch for signs of fever or worsening of the rash.  Today's evaluation has revealed no signs of a dangerous process. Discussed diagnosis with patient. Patient aware of  their diagnosis, possible red flag symptoms to watch out for and need for close follow up. Patient understands verbal and written discharge instructions. Patient comfortable with plan and disposition.  Patient has a clear mental status at this time, good insight into illness (after discussion and teaching) and has clear judgment to make decisions regarding their care.  Documentation was completed with the aid of voice recognition software. Transcription may contain typographical errors. Final Clinical Impressions(s) / UC Diagnoses   Final diagnoses:  Eczema of both hands   Discharge Instructions  None    ED Prescriptions    None     Controlled Substance Prescriptions [place] Controlled Substance Registry consulted? Not Applicable   Lurline Idol, [place] 12/07/17 1529

## 2018-01-09 IMAGING — CR DG CHEST 2V
2 series · 2 of 2 positions shown · non-contrast
Comparison: December 21, 2015

CLINICAL DATA: Chest pain and shortness of breath

EXAM:
CHEST  2 VIEW

[chest pa]
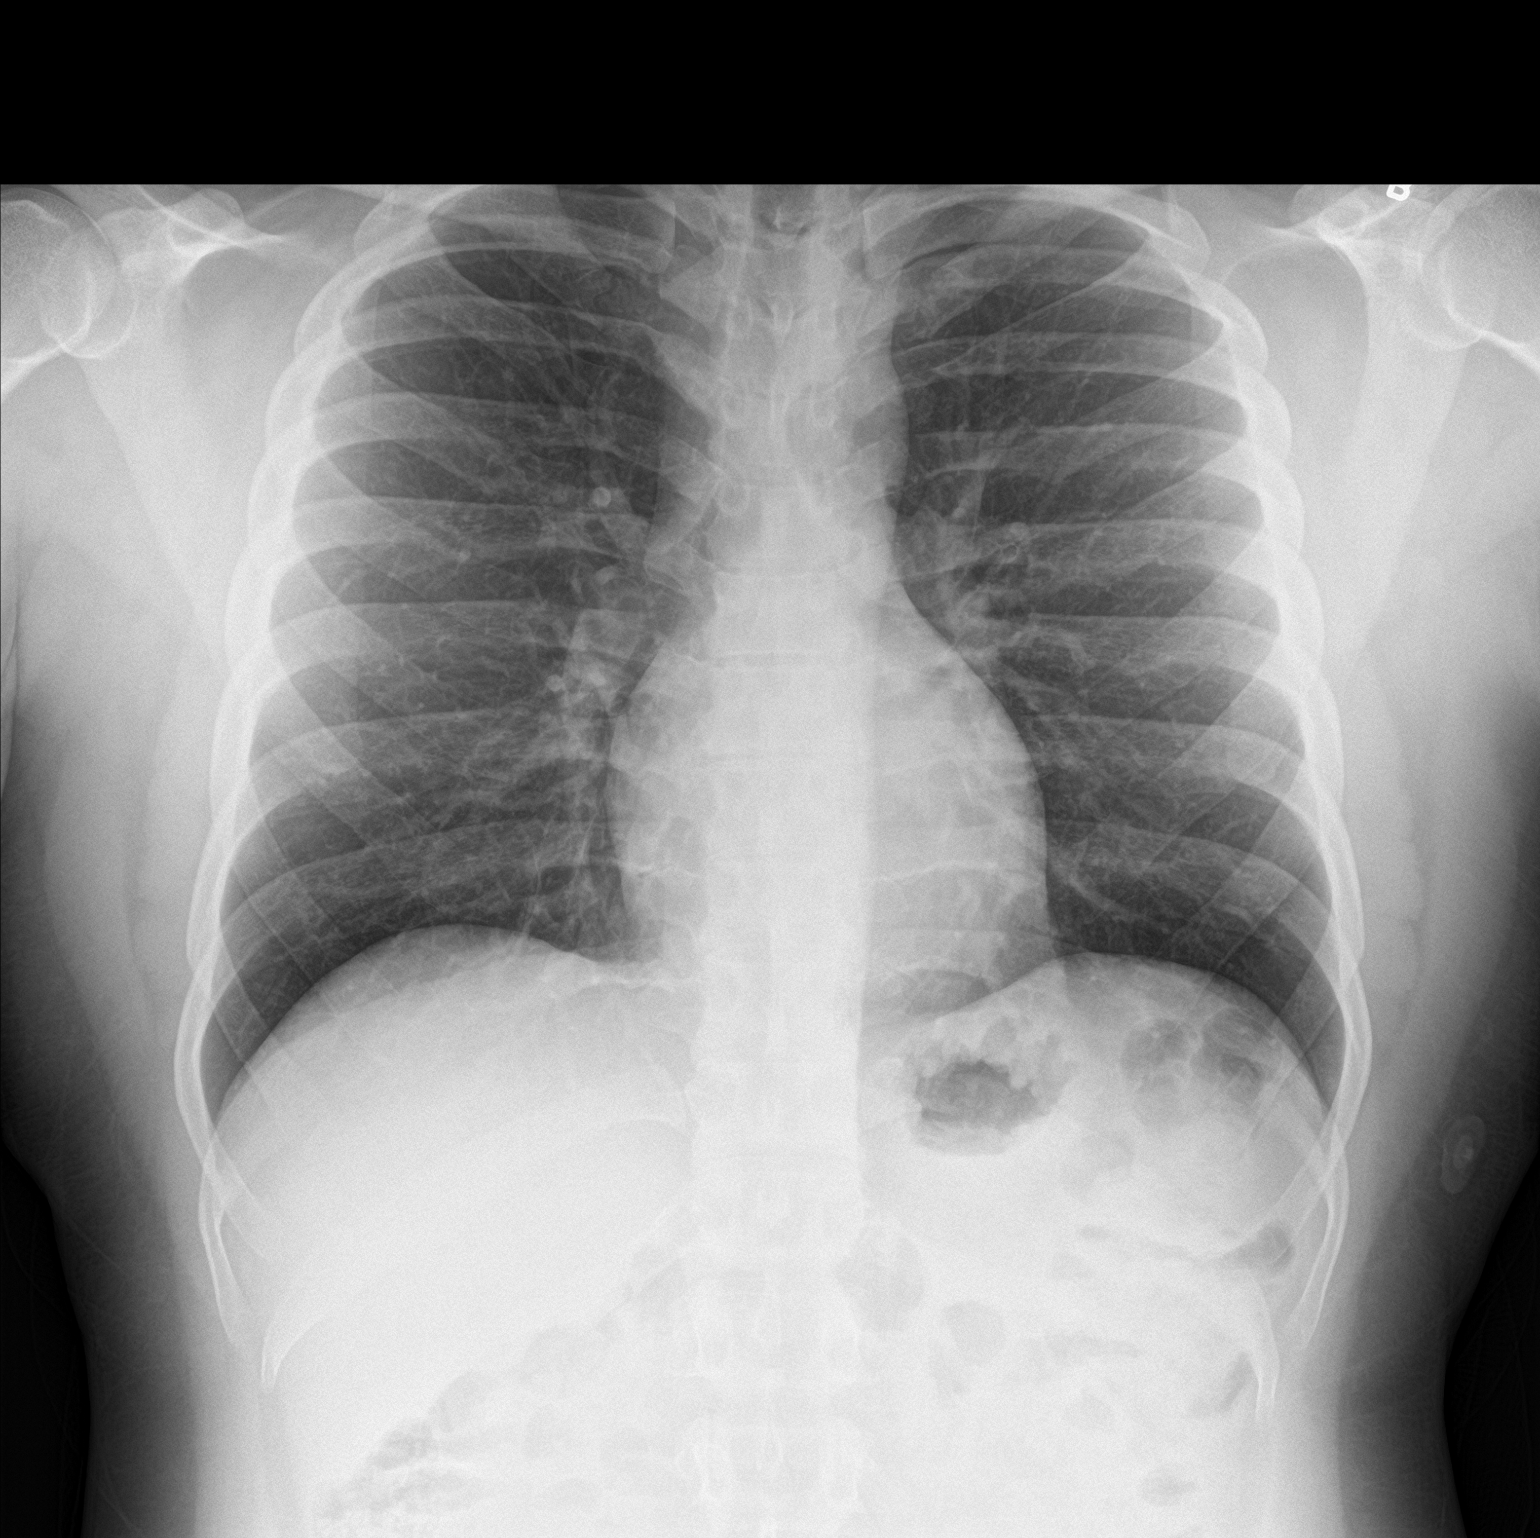

[chest lat]
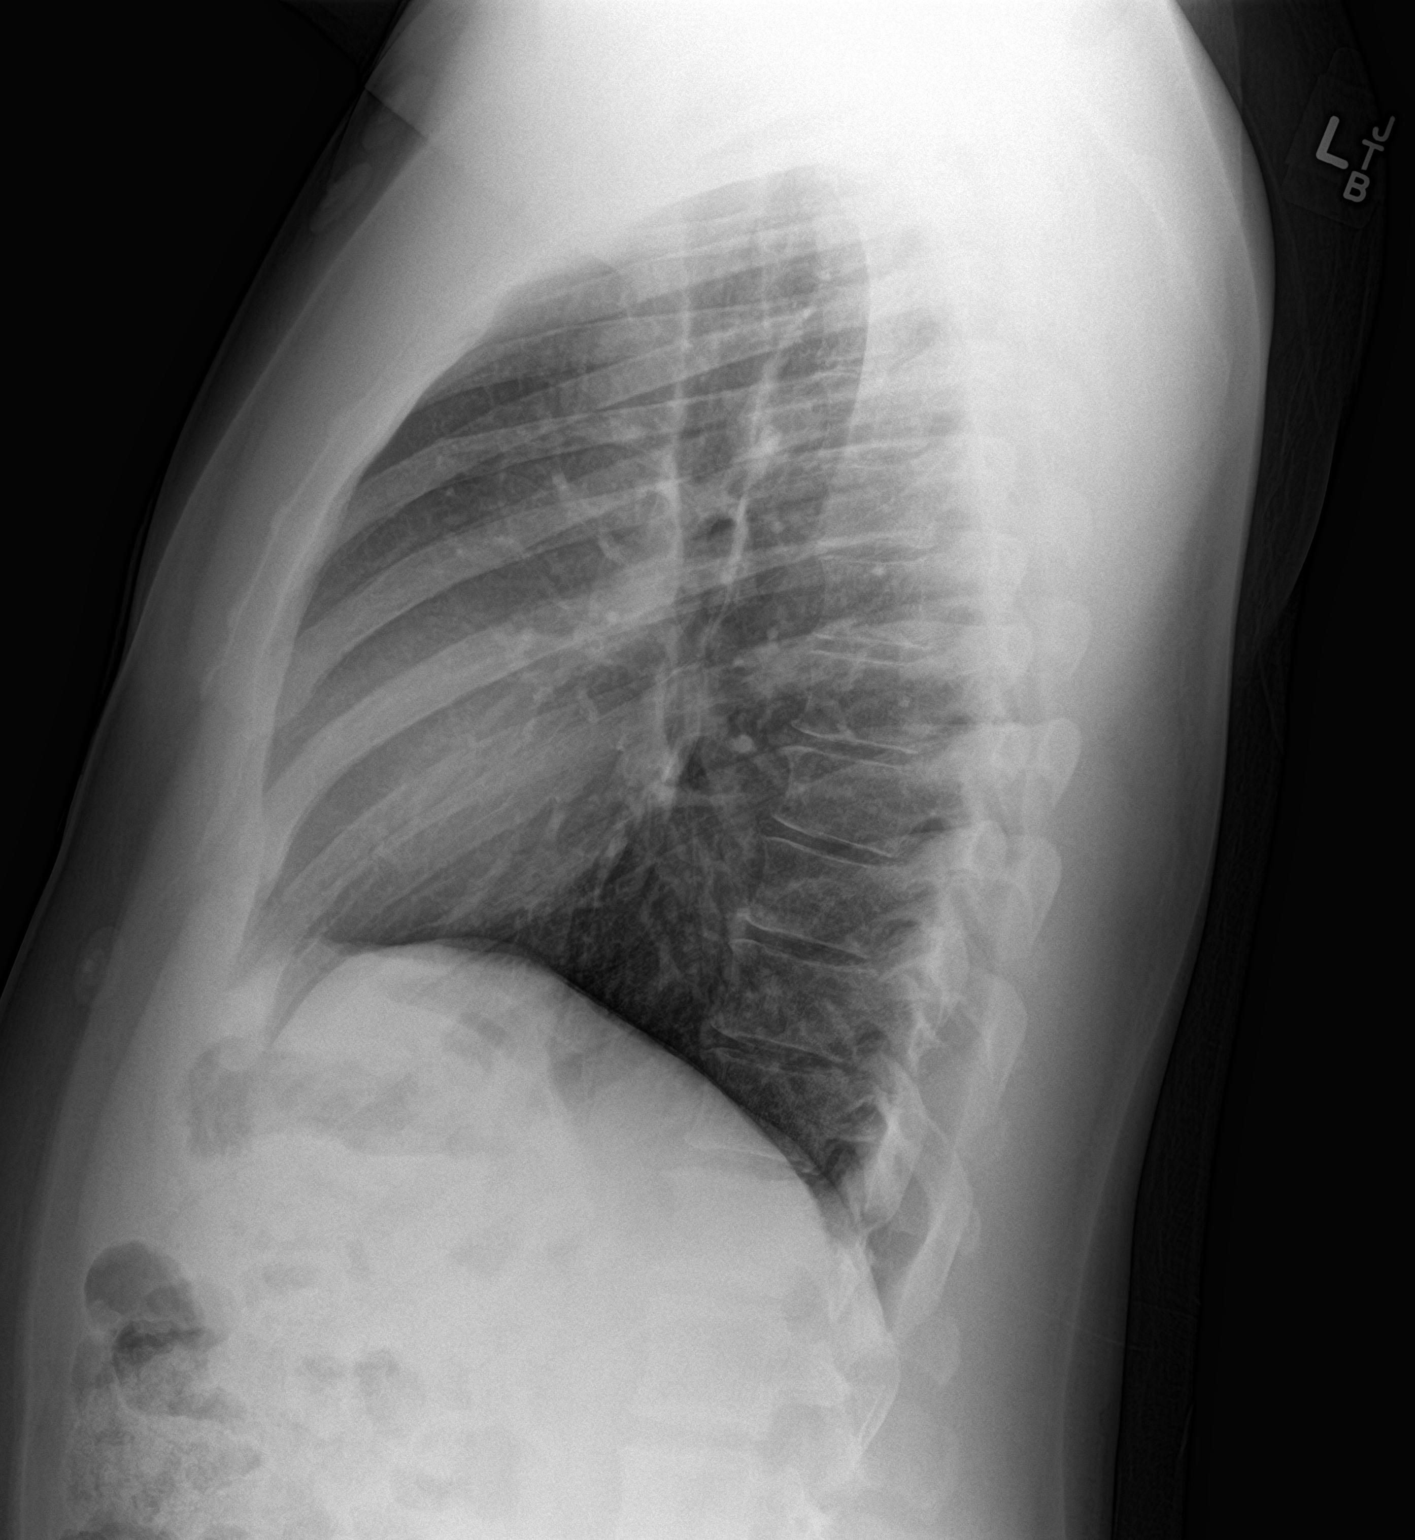

[2 of 2 positions shown; findings below may reference images not displayed]

FINDINGS: Lungs clear. Heart size and pulmonary vascularity are normal. No
adenopathy. No pneumothorax. No bone lesions.
IMPRESSION: No edema or consolidation.

## 2018-01-10 ENCOUNTER — Ambulatory Visit: Payer: Self-pay | Admitting: Family Medicine

## 2018-02-14 ENCOUNTER — Encounter: Payer: Self-pay | Admitting: Family Medicine

## 2018-02-14 ENCOUNTER — Ambulatory Visit (INDEPENDENT_AMBULATORY_CARE_PROVIDER_SITE_OTHER): Payer: 59 | Admitting: Family Medicine

## 2018-02-14 VITALS — BP 138/96 | HR 88 | Temp 98.4°F | Ht 68.5 in | Wt 225.8 lb

## 2018-02-14 DIAGNOSIS — L2089 Other atopic dermatitis: Secondary | ICD-10-CM | POA: Diagnosis not present

## 2018-02-14 DIAGNOSIS — Z7689 Persons encountering health services in other specified circumstances: Secondary | ICD-10-CM

## 2018-02-14 DIAGNOSIS — D72829 Elevated white blood cell count, unspecified: Secondary | ICD-10-CM | POA: Diagnosis not present

## 2018-02-14 DIAGNOSIS — Z6833 Body mass index (BMI) 33.0-33.9, adult: Secondary | ICD-10-CM

## 2018-02-14 DIAGNOSIS — R7309 Other abnormal glucose: Secondary | ICD-10-CM

## 2018-02-14 DIAGNOSIS — R030 Elevated blood-pressure reading, without diagnosis of hypertension: Secondary | ICD-10-CM

## 2018-02-14 DIAGNOSIS — E785 Hyperlipidemia, unspecified: Secondary | ICD-10-CM

## 2018-02-14 LAB — HEMOGLOBIN A1C: Hgb A1c MFr Bld: 6.7 % — ABNORMAL HIGH (ref 4.6–6.5)

## 2018-02-14 LAB — CBC WITH DIFFERENTIAL/PLATELET
BASOS ABS: 0 10*3/uL (ref 0.0–0.1)
BASOS PCT: 0.7 % (ref 0.0–3.0)
EOS ABS: 0.2 10*3/uL (ref 0.0–0.7)
Eosinophils Relative: 3.9 % (ref 0.0–5.0)
HCT: 46.3 % (ref 39.0–52.0)
Hemoglobin: 15 g/dL (ref 13.0–17.0)
LYMPHS ABS: 2 10*3/uL (ref 0.7–4.0)
Lymphocytes Relative: 31.7 % (ref 12.0–46.0)
MCHC: 32.5 g/dL (ref 30.0–36.0)
MCV: 90.4 fl (ref 78.0–100.0)
Monocytes Absolute: 1 10*3/uL (ref 0.1–1.0)
Monocytes Relative: 16 % — ABNORMAL HIGH (ref 3.0–12.0)
NEUTROS ABS: 3.1 10*3/uL (ref 1.4–7.7)
NEUTROS PCT: 47.7 % (ref 43.0–77.0)
PLATELETS: 223 10*3/uL (ref 150.0–400.0)
RBC: 5.12 Mil/uL (ref 4.22–5.81)
RDW: 16.9 % — AB (ref 11.5–15.5)
WBC: 6.4 10*3/uL (ref 4.0–10.5)

## 2018-02-14 LAB — LIPID PANEL
CHOL/HDL RATIO: 3
CHOLESTEROL: 226 mg/dL — AB (ref 0–200)
HDL: 72 mg/dL (ref >39.00)
LDL CALC: 138 mg/dL — AB (ref 0–99)
NonHDL: 154.04
TRIGLYCERIDES: 82 mg/dL (ref 0.0–149.0)
VLDL: 16.4 mg/dL (ref 0.0–40.0)

## 2018-02-14 LAB — COMPREHENSIVE METABOLIC PANEL
ALT: 26 U/L (ref 0–53)
AST: 26 U/L (ref 0–37)
Albumin: 4.8 g/dL (ref 3.5–5.2)
Alkaline Phosphatase: 60 U/L (ref 39–117)
BILIRUBIN TOTAL: 0.6 mg/dL (ref 0.2–1.2)
BUN: 11 mg/dL (ref 6–23)
CHLORIDE: 101 meq/L (ref 96–112)
CO2: 28 meq/L (ref 19–32)
Calcium: 9.4 mg/dL (ref 8.4–10.5)
Creatinine, Ser: 1.12 mg/dL (ref 0.40–1.50)
GFR: 91.85 mL/min (ref >60.00)
GLUCOSE: 103 mg/dL — AB (ref 70–99)
Potassium: 4.1 mEq/L (ref 3.5–5.1)
Sodium: 135 mEq/L (ref 135–145)
Total Protein: 8.5 g/dL — ABNORMAL HIGH (ref 6.0–8.3)

## 2018-02-14 NOTE — Patient Instructions (Signed)
Good to see you today  Please use your steroid cream twice a day for 7-10 days- use small amount and rub in thoroughly. A couple of hours later, please apply a generous amount of vaseline, aquaphor or eucerin (store brand is fine) and cover with gloves (cotton or vinyl) overnight  Please let me know how you are doing in 2-3 weeks  I will notify you of lab tests  Please follow up in 6 months

## 2018-02-14 NOTE — Progress Notes (Signed)
Subjective:    Patient ID: Anthony Carroll, male    DOB: [birth year], 42 y.o.   MRN: [id number]  HPI This is a 42 yo male who presents today to establish care. Has been getting most of his care through ER/Urgent Care and employee health. He is accompanied by his wife. He works at [employer].    Last CPE- 7/19- had elevated WBC, lipids Tdap- 05/12/2015 Flu- declines Dental- not regular Eye- within last year Exercise- works out, bike, [employer]- 3x week Sleep- 5 hours a night, depending on job schedule Diet- breakfast- skips, salads most days, baked chicken, likes sweets.  Has eczema on hands. Chronic. Saw dermatologist 12/18, was given steroid cream that he used for a couple of days with temporary improvement. Has been applying vasoline at night and wearing vinyl gloves with some improvement. Some pain with cracking. Has seen allergist in past (most recent visit 3/18) and was noted to have multiple allergies. Dr. Princess Bruins possible dupilumab in future.   He denies chest pain, SOB, abdominal pain/diarrhea/constipation, unintentional weight loss, muscle/joint pain.   Past Medical History:  Diagnosis Date  . Eczema   . Glaucoma    Past Surgical History:  Procedure Laterality Date  . ANKLE SURGERY     left 1999   Family History  Problem Relation Age of Onset  . Diabetes Father   . Hypertension Other   . Hypertension Mother   . Hypertension Brother    Social History   Tobacco Use  . Smoking status: Former Smoker    Packs/day: 1.00    Years: 6.00    Pack years: 6.00    Types: Cigarettes    Last attempt to quit: 04/17/2017    Years since quitting: 0.8  . Smokeless tobacco: Never Used  Substance Use Topics  . Alcohol use: Yes  . Drug use: No     Review of Systems Per HPI    Objective:   Physical Exam  Constitutional: He is oriented to person, place, and time. He appears well-developed and well-nourished. No distress.  HENT:  Head: Normocephalic and atraumatic.   Mouth/Throat: Oropharynx is clear and moist.  Eyes: Conjunctivae are normal.  Cardiovascular: Normal rate, regular rhythm and normal heart sounds.  Pulmonary/Chest: Effort normal and breath sounds normal.  Musculoskeletal: He exhibits no edema.  Neurological: He is alert and oriented to person, place, and time.  Skin: Skin is warm and dry. He is not diaphoretic.  Bilateral palms of hands thickened with flaking and superficial cracking. No erythema or drainage.   Psychiatric: He has a normal mood and affect. His behavior is normal. Thought content normal.  Vitals reviewed.     BP (!) 138/96 (BP Location: Left Arm, Patient Position: Sitting, Cuff Size: Normal)   Pulse 88   Temp 98.4 F (36.9 C) (Oral)   Ht 5' 8.5" (1.74 m)   Wt 225 lb 12 oz (102.4 kg)   SpO2 97%   BMI 33.83 kg/m  Wt Readings from Last 3 Encounters:  02/14/18 225 lb 12 oz (102.4 kg)  11/14/17 220 lb (99.8 kg)  09/02/17 230 lb 12.8 oz (104.7 kg)   BP Readings from Last 3 Encounters:  02/14/18 (!) 138/96  12/07/17 136/87  11/14/17 (!) 166/99       Assessment & Plan:  1. Encounter to establish care - Discussed and encouraged healthy lifestyle choices- adequate sleep, regular exercise, stress management and healthy food choices.   2. Other atopic dermatitis - he has steroid cream at  home, has not used recently and doesn't sound as though he used for more than a few days - provided written and oral instructions for twice daily use for no more than 10 days as well as additional moisturizing at bedtime - I have asked him to let me know how he is doing in a couple of weeks  3. Body mass index (bmi) 33.0-33.9, adult - Hemoglobin A1c - Lipid Panel - discussed diet and encouraged him to eat adequate protein and 5 servings of vegetables daily - poor sleep likely contributing to inability to lose weight, encouraged him to increase sleep  4. Elevated lipids - CBC with Differential - Comprehensive metabolic  panel - Hemoglobin A1c - Lipid Panel  5. Leukocytosis, unspecified type - CBC with Differential  6. Elevated blood pressure reading - discussed weight loss -follow up in 6 months  Olean Reeeborah Rudi Bunyard, FNP-BC  [facility], [facility]  02/15/2018 7:28 AM

## 2018-02-14 NOTE — Progress Notes (Signed)
[[[[[[[[[[[[[[[[[[[[[[[[[[[[[[[[[[[[[[[[[[[[[[[[[[[[[[[[[[[[[[[[[[[[[[[[[[[[[[[[[[[[[[[[[[[[[[[[[[[[[[[[[[[[[[[[[[[[[[,  k,k,,kkk,,,,

## 2018-02-15 ENCOUNTER — Encounter: Payer: Self-pay | Admitting: Family Medicine

## 2018-02-15 DIAGNOSIS — Z6833 Body mass index (BMI) 33.0-33.9, adult: Secondary | ICD-10-CM | POA: Insufficient documentation

## 2018-02-15 DIAGNOSIS — E1169 Type 2 diabetes mellitus with other specified complication: Secondary | ICD-10-CM | POA: Insufficient documentation

## 2018-02-15 DIAGNOSIS — E785 Hyperlipidemia, unspecified: Secondary | ICD-10-CM | POA: Insufficient documentation

## 2018-02-18 NOTE — Addendum Note (Signed)
Addended by: Olean Ree B on: 02/18/2018 08:53 AM   Modules accepted: Orders

## 2018-03-04 ENCOUNTER — Other Ambulatory Visit (INDEPENDENT_AMBULATORY_CARE_PROVIDER_SITE_OTHER): Payer: 59

## 2018-03-04 DIAGNOSIS — E785 Hyperlipidemia, unspecified: Secondary | ICD-10-CM

## 2018-03-04 DIAGNOSIS — R030 Elevated blood-pressure reading, without diagnosis of hypertension: Secondary | ICD-10-CM | POA: Diagnosis not present

## 2018-03-04 DIAGNOSIS — E119 Type 2 diabetes mellitus without complications: Secondary | ICD-10-CM

## 2018-03-04 DIAGNOSIS — E1169 Type 2 diabetes mellitus with other specified complication: Secondary | ICD-10-CM

## 2018-03-04 DIAGNOSIS — R7309 Other abnormal glucose: Secondary | ICD-10-CM

## 2018-03-04 LAB — MICROALBUMIN / CREATININE URINE RATIO
Creatinine,U: 204.1 mg/dL
Microalb Creat Ratio: 5.1 mg/g (ref 0.0–30.0)
Microalb, Ur: 10.5 mg/dL — ABNORMAL HIGH (ref 0.0–1.9)

## 2018-03-04 LAB — GLUCOSE, RANDOM: Glucose, Bld: 96 mg/dL (ref 70–99)

## 2018-03-04 LAB — PSA: PSA: 1.23 ng/mL (ref 0.10–4.00)

## 2018-03-04 LAB — TSH: TSH: 1.8 u[IU]/mL (ref 0.35–4.50)

## 2018-03-05 MED ORDER — METFORMIN HCL 500 MG PO TABS: ORAL_TABLET | ORAL | 1 refills | Status: DC

## 2018-03-05 MED ORDER — ATORVASTATIN CALCIUM 20 MG PO TABS: 20.0000 mg | ORAL_TABLET | Freq: Every day | ORAL | 3 refills | Status: DC

## 2018-03-05 NOTE — Addendum Note (Signed)
Addended by: Olean ReeGESSNER, Zahira Brummond B on: 03/05/2018 08:15 AM   Modules accepted: Orders

## 2018-03-07 ENCOUNTER — Other Ambulatory Visit: Payer: Self-pay

## 2018-03-07 DIAGNOSIS — E119 Type 2 diabetes mellitus without complications: Secondary | ICD-10-CM

## 2018-03-07 DIAGNOSIS — E785 Hyperlipidemia, unspecified: Secondary | ICD-10-CM

## 2018-03-07 DIAGNOSIS — E1169 Type 2 diabetes mellitus with other specified complication: Secondary | ICD-10-CM

## 2018-03-07 MED ORDER — METFORMIN HCL 500 MG PO TABS: ORAL_TABLET | ORAL | 1 refills | Status: DC

## 2018-03-07 MED ORDER — ATORVASTATIN CALCIUM 20 MG PO TABS
20.0000 mg | ORAL_TABLET | Freq: Every day | ORAL | 3 refills | Status: DC
Start: 2018-03-07 — End: 2018-08-22

## 2018-03-13 ENCOUNTER — Encounter: Payer: Self-pay | Admitting: Family Medicine

## 2018-03-13 ENCOUNTER — Ambulatory Visit (INDEPENDENT_AMBULATORY_CARE_PROVIDER_SITE_OTHER): Payer: 59 | Admitting: Family Medicine

## 2018-03-13 ENCOUNTER — Telehealth: Payer: Self-pay | Admitting: Family Medicine

## 2018-03-13 VITALS — BP 132/80 | HR 72 | Temp 98.3°F | Ht 68.5 in | Wt 220.8 lb

## 2018-03-13 DIAGNOSIS — L2089 Other atopic dermatitis: Secondary | ICD-10-CM

## 2018-03-13 MED ORDER — PREDNISONE 10 MG PO TABS: ORAL_TABLET | ORAL | 0 refills | Status: DC

## 2018-03-13 NOTE — Telephone Encounter (Signed)
Spouse dropped off fmla paperwork She wanted intermittent to take pt back and for to dr appointments  Paperwork in dr tower in box

## 2018-03-13 NOTE — Patient Instructions (Signed)
Wash with cetaphil  Use cetaphil moisturizer  Avoid allergens/things you are allergic to   Take the prednisone as directed ( you will have increased blood glucose on this temporarily) - you will be more thirsty and urinate more   Use the triamcinolone cream if it helps   We will work on allergy appointment   Here is some info on a new potential medicine

## 2018-03-13 NOTE — Assessment & Plan Note (Signed)
Reviewed chart including hx of both dermatology and allergy evaluation for this severe condition on hands Pt is currently only using cetaphil cleanser and triamcinolone cream (? Not using the products from allergist and has poor memory of that appt)  Symptoms are severe-did px 30 mg prednisone taper to help calm down in acute phase  Did explain (with new DM2) that this would inc blood glucose and what to expect (he voiced understanding)  Given info on Dupixent -as it appears his allergist considered it /he may be a good candidate  >25 minutes spent in face to face time with patient, >50% spent in counselling or coordination of care -including disc of sympt care and allergen avoidance  Urgent ref made back to Dr Bobbitt/allergy Also will alert PCP I saw him today  inst pt to update if prednisone does not help acutely

## 2018-03-13 NOTE — Progress Notes (Signed)
Subjective:    Patient ID: Anthony Carroll, male    DOB: [birth year], 42 y.o.   MRN: [id number]  HPI  42 yo pt of NP Leone Payor here with swelling of both hands   Wt Readings from Last 3 Encounters:  03/13/18 220 lb 12 oz (100.1 kg)  02/14/18 225 lb 12 oz (102.4 kg)  11/14/17 220 lb (99.8 kg)   33.08 kg/m   He is a light cig smoker with hx of allergies/environmental with atopic dermatitis  and high lipids   Has bad eczema- cracking/ splitting of skin  Thinks this causing pain/burning and swelling  Hard to sleep  Going on for more than 6 mo but worse in past few days  Has had it since childhood but much worse now  occ elbows/ neck as well   Saw allergist in December- px triamcinolone cream and adv allergist ref   Saw Dr Rod Can (allergist) in march Pt does not remember anything about the visit  Mention was made of Dupixent tx (pt did not remember) - ? Info given  Pt states he is allergic to "almost everything" He does have to be mindful of some fruit allergies as well (is careful about this)  Also has nasal allergies occ asthma symptoms in the spring only    Using triamcinolone 0.1% cream  Uses it twice daily  No longer helping Wears gloves on and off at work/not latex (is all to latex)  Used to use vaseline but now it makes his skin burn   Washes with cetaphil  He tries not to wash hands at work with soap they have there     Patient Active Problem List   Diagnosis Date Noted  . Body mass index (bmi) 33.0-33.9, adult 02/15/2018  . Elevated lipids 02/15/2018  . Seasonal and perennial allergic rhinitis 09/02/2017  . Allergic conjunctivitis 09/02/2017  . Oral allergy syndrome 09/02/2017  . Atopic dermatitis 09/02/2017  . Chest pain, unspecified 04/24/2017  . Tobacco abuse 04/24/2017   Past Medical History:  Diagnosis Date  . Eczema   . Glaucoma    Past Surgical History:  Procedure Laterality Date  . ANKLE SURGERY     left 1999   Social History   Tobacco Use    . Smoking status: Light Tobacco Smoker    Packs/day: 1.00    Years: 6.00    Pack years: 6.00    Types: Cigarettes  . Smokeless tobacco: Never Used  Substance Use Topics  . Alcohol use: Yes  . Drug use: No   Family History  Problem Relation Age of Onset  . Diabetes Father   . Hypertension Other   . Hypertension Mother   . Hypertension Brother    No Known Allergies Current Outpatient Medications on File Prior to Visit  Medication Sig Dispense Refill  . atorvastatin (LIPITOR) 20 MG tablet Take 1 tablet (20 mg total) by mouth daily. 90 tablet 3  . desonide (DESOWEN) 0.05 % ointment Apply 1 application topically 2 (two) times daily. 15 g 5  . levocetirizine (XYZAL) 5 MG tablet Take 1 tablet (5 mg total) by mouth every evening. 30 tablet 5  . metFORMIN (GLUCOPHAGE) 500 MG tablet Take one tablet with dinner for a week then take one tablet with breakfast and dinner daily. 180 tablet 1  . mometasone (ELOCON) 0.1 % ointment Apply topically daily. 45 g 5  . Multiple Vitamins-Minerals (MULTIVITAMIN WITH MINERALS) tablet Take 1 tablet by mouth daily.    Marland Kitchen triamcinolone cream (KENALOG)  0.1 %   3   No current facility-administered medications on file prior to visit.     Review of Systems  Constitutional: Negative for activity change, appetite change, fatigue, fever and unexpected weight change.  HENT: Positive for postnasal drip, rhinorrhea and sneezing. Negative for congestion, sore throat and trouble swallowing.   Eyes: Negative for pain, redness, itching and visual disturbance.  Respiratory: Negative for cough, chest tightness, shortness of breath and wheezing.   Cardiovascular: Negative for chest pain and palpitations.  Gastrointestinal: Negative for abdominal pain, blood in stool, constipation, diarrhea and nausea.  Endocrine: Negative for cold intolerance, heat intolerance, polydipsia and polyuria.  Genitourinary: Negative for difficulty urinating, dysuria, frequency and urgency.   Musculoskeletal: Negative for arthralgias, joint swelling and myalgias.  Skin: Positive for rash. Negative for pallor.  Neurological: Negative for dizziness, tremors, weakness, numbness and headaches.  Hematological: Negative for adenopathy. Does not bruise/bleed easily.  Psychiatric/Behavioral: Negative for decreased concentration and dysphoric mood. The patient is not nervous/anxious.        Objective:   Physical Exam  Constitutional: He appears well-developed and well-nourished. No distress.  overwt and well appearing  HENT:  Head: Normocephalic and atraumatic.  Mouth/Throat: Oropharynx is clear and moist.  Eyes: Pupils are equal, round, and reactive to light. Conjunctivae and EOM are normal. Right eye exhibits no discharge. Left eye exhibits no discharge. No scleral icterus.  Neck: Normal range of motion.  Cardiovascular: Normal rate, regular rhythm and normal heart sounds.  Pulmonary/Chest: Effort normal and breath sounds normal. No stridor. No respiratory distress. He has no wheezes. He has no rales.  Good air exch  Lymphadenopathy:    He has no cervical adenopathy.  Neurological: He is alert. No cranial nerve deficit.  Skin: Skin is warm and dry. Rash noted. There is erythema.  Mild to moderate eczema with scale and slt injection on upper arms and elbow area   Much more severe on hands Skin is dry/scaley/cracked and some peeling- palms and dorsal hands  Pink color-no severe erythema  No drainage  Mild general swelling of hands as well  Tender to the touch in some areas  No excoriations  No vesicles or papules   Nails appear normal  Psychiatric: He has a normal mood and affect.          Assessment & Plan:   Problem List Items Addressed This Visit      Musculoskeletal and Integument   Atopic dermatitis - Primary    Reviewed chart including hx of both dermatology and allergy evaluation for this severe condition on hands Pt is currently only using cetaphil  cleanser and triamcinolone cream (? Not using the products from allergist and has poor memory of that appt)  Symptoms are severe-did px 30 mg prednisone taper to help calm down in acute phase  Did explain (with new DM2) that this would inc blood glucose and what to expect (he voiced understanding)  Given info on Dupixent -as it appears his allergist considered it /he may be a good candidate  >25 minutes spent in face to face time with patient, >50% spent in counselling or coordination of care -including disc of sympt care and allergen avoidance  Urgent ref made back to Dr Bobbitt/allergy Also will alert PCP I saw him today  inst pt to update if prednisone does not help acutely      Relevant Orders   Ambulatory referral to Allergy

## 2018-03-14 NOTE — Telephone Encounter (Signed)
Done and in IN box 

## 2018-03-14 NOTE — Progress Notes (Signed)
Noted  

## 2018-03-14 NOTE — Telephone Encounter (Signed)
Left message pt to call office Copy for pt Copy for scan

## 2018-03-17 ENCOUNTER — Ambulatory Visit (INDEPENDENT_AMBULATORY_CARE_PROVIDER_SITE_OTHER): Payer: 59 | Admitting: Allergy and Immunology

## 2018-03-17 ENCOUNTER — Encounter: Payer: Self-pay | Admitting: Allergy and Immunology

## 2018-03-17 DIAGNOSIS — T781XXD Other adverse food reactions, not elsewhere classified, subsequent encounter: Secondary | ICD-10-CM

## 2018-03-17 DIAGNOSIS — L2089 Other atopic dermatitis: Secondary | ICD-10-CM | POA: Diagnosis not present

## 2018-03-17 DIAGNOSIS — H1013 Acute atopic conjunctivitis, bilateral: Secondary | ICD-10-CM

## 2018-03-17 DIAGNOSIS — J3089 Other allergic rhinitis: Secondary | ICD-10-CM | POA: Diagnosis not present

## 2018-03-17 MED ORDER — OLOPATADINE HCL 0.7 % OP SOLN: 1.0000 [drp] | OPHTHALMIC | 5 refills | Status: DC

## 2018-03-17 MED ORDER — CRISABOROLE 2 % EX OINT: 1.0000 "application " | TOPICAL_OINTMENT | Freq: Two times a day (BID) | CUTANEOUS | 5 refills | Status: DC

## 2018-03-17 NOTE — Patient Instructions (Addendum)
Atopic dermatitis Poorly controlled.    Samples and a prescription have been provided for Eucrisa (crisaborole) 2% ointment twice a day to affected areas as needed.  If the hand dermatitis persists or progresses despite compliance with Eucrisa, we will submit paperwork for Dupixent.  Continue appropriate skin care measures.  Seasonal and perennial allergic rhinitis  Continue appropriate allergen avoidance measures, levocetirizine as needed, Xhance as needed, and nasal saline irrigation as needed.  The patient is interested in the possibility of initiating immunotherapy if insurance coverage is favorable. He will let us know how he would like to proceed.  Allergic conjunctivitis  Treatment plan as outlined above for allergic rhinitis.  Continue Pazeo, one drop per eye daily as needed.  Oral allergy syndrome  All foods causing symptoms are to be avoided.  Should symptoms progress beyond the mouth and throat, 911 is to be called immediately.   Return in about 5 months (around 08/16/2018), or if symptoms worsen or fail to improve.

## 2018-03-17 NOTE — Assessment & Plan Note (Signed)
Poorly controlled.    Samples and a prescription have been provided for Eucrisa (crisaborole) 2% ointment twice a day to affected areas as needed.  If the hand dermatitis persists or progresses despite compliance with Eucrisa, we will submit paperwork for Dupixent.  Continue appropriate skin care measures.

## 2018-03-17 NOTE — Progress Notes (Signed)
Follow-up Note  RE: Anthony Carroll MRN: [id number] DOB: 06-Nov-1975 Date of Office Visit: 03/17/2018  Primary care provider: Emi Belfast, FNP Referring provider: Emi Belfast, FNP  History of present illness: Anthony Carroll is a 42 y.o. male with allergic rhinoconjunctivitis, oral allergy syndrome, and atopic dermatitis presenting today for follow-up.  He was previously seen in this clinic for his initial evaluation on September 02, 2017.  He reports that his eczema, particularly his hand dermatitis, has not been well controlled.  He has tried and failed desonide 0.05% ointment, triamcinolone 0.1% ointment, and mometasone 0.1% ointment.  His nasal allergy symptoms currently well controlled with levocetirizine and Xhance nasal spray as needed.  Assessment and plan: Atopic dermatitis Poorly controlled.    Samples and a prescription have been provided for Eucrisa (crisaborole) 2% ointment twice a day to affected areas as needed.  If the hand dermatitis persists or progresses despite compliance with Eucrisa, we will submit paperwork for Dupixent.  Continue appropriate skin care measures.  Seasonal and perennial allergic rhinitis  Continue appropriate allergen avoidance measures, levocetirizine as needed, Xhance as needed, and nasal saline irrigation as needed.  The patient is interested in the possibility of initiating immunotherapy if insurance coverage is favorable. He will let us know how he would like to proceed.  Allergic conjunctivitis  Treatment plan as outlined above for allergic rhinitis.  Continue Pazeo, one drop per eye daily as needed.  Oral allergy syndrome  All foods causing symptoms are to be avoided.  Should symptoms progress beyond the mouth and throat, 911 is to be called immediately.   Meds ordered this encounter  Medications  . Crisaborole (EUCRISA) 2 % OINT    Sig: Apply 1 application topically 2 (two) times daily.    Dispense:  60 g    Refill:  5  .  Olopatadine HCl (PAZEO) 0.7 % SOLN    Sig: Place 1 drop into both eyes 1 day or 1 dose.    Dispense:  1 Bottle    Refill:  5    Physical examination: Blood pressure 130/88, pulse 72, resp. rate 16, height 5\' 9"  (1.753 m), weight 224 lb (101.6 kg), SpO2 95 %.  General: Alert, interactive, in no acute distress. HEENT: TMs pearly gray, turbinates mildly edematous without discharge, post-pharynx mildly erythematous. Neck: Supple without lymphadenopathy. Lungs: Clear to auscultation without wheezing, rhonchi or rales. CV: Normal S1, S2 without murmurs. Skin: Hyperpigmented and thickened patches on the hands, fingers, and palms bilaterally.  The following portions of the patient's history were reviewed and updated as appropriate: allergies, current medications, past family history, past medical history, past social history, past surgical history and problem list.  Allergies as of 03/17/2018   No Known Allergies     Medication List        Accurate as of 03/17/18  9:47 PM. Always use your most recent med list.          atorvastatin 20 MG tablet Commonly known as:  LIPITOR Take 1 tablet (20 mg total) by mouth daily.   Crisaborole 2 % Oint Apply 1 application topically 2 (two) times daily.   desonide 0.05 % ointment Commonly known as:  DESOWEN Apply 1 application topically 2 (two) times daily.   levocetirizine 5 MG tablet Commonly known as:  XYZAL Take 1 tablet (5 mg total) by mouth every evening.   metFORMIN 500 MG tablet Commonly known as:  GLUCOPHAGE Take one tablet with dinner for a week then take  one tablet with breakfast and dinner daily.   mometasone 0.1 % ointment Commonly known as:  ELOCON Apply topically daily.   multivitamin with minerals tablet Take 1 tablet by mouth daily.   Olopatadine HCl 0.7 % Soln Place 1 drop into both eyes 1 day or 1 dose.   predniSONE 10 MG tablet Commonly known as:  DELTASONE Take 10 mg by mouth daily with breakfast.     triamcinolone cream 0.1 % Commonly known as:  KENALOG       No Known Allergies  I appreciate the opportunity to take part in Vang's care. Please do not hesitate to contact me with questions.  Sincerely,   R. Jorene Guest, MD

## 2018-03-17 NOTE — Assessment & Plan Note (Signed)
   Treatment plan as outlined above for allergic rhinitis.  Continue Pazeo, one drop per eye daily as needed. 

## 2018-03-17 NOTE — Assessment & Plan Note (Signed)
   Continue appropriate allergen avoidance measures, levocetirizine as needed, Xhance as needed, and nasal saline irrigation as needed.  The patient is interested in the possibility of initiating immunotherapy if insurance coverage is favorable. He will let us know how he would like to proceed.

## 2018-03-17 NOTE — Assessment & Plan Note (Signed)
   All foods causing symptoms are to be avoided.  Should symptoms progress beyond the mouth and throat, 911 is to be called immediately.

## 2018-03-18 ENCOUNTER — Telehealth: Payer: Self-pay

## 2018-03-18 NOTE — Telephone Encounter (Signed)
Pazeo not covered by insurance, called in olopatadine 0.2% (tier 2) to [facility] in [place], [place]

## 2018-03-18 NOTE — Telephone Encounter (Signed)
That's fine. thanks

## 2018-04-28 ENCOUNTER — Ambulatory Visit: Payer: 59 | Admitting: Family Medicine

## 2018-04-28 ENCOUNTER — Telehealth: Payer: Self-pay | Admitting: Family Medicine

## 2018-04-28 ENCOUNTER — Encounter: Payer: Self-pay | Admitting: Family Medicine

## 2018-04-28 ENCOUNTER — Ambulatory Visit (INDEPENDENT_AMBULATORY_CARE_PROVIDER_SITE_OTHER)
Admission: RE | Admit: 2018-04-28 | Discharge: 2018-04-28 | Disposition: A | Discharge status: Home / Self Care | Payer: Worker's Compensation | Source: Ambulatory Visit | Attending: Family Medicine | Admitting: Family Medicine

## 2018-04-28 VITALS — BP 128/82 | HR 83 | Temp 98.2°F | Ht 69.0 in | Wt 219.0 lb

## 2018-04-28 DIAGNOSIS — S8992XA Unspecified injury of left lower leg, initial encounter: Secondary | ICD-10-CM

## 2018-04-28 NOTE — Progress Notes (Signed)
   Subjective:    Patient ID: Anthony Carroll, male    DOB: 13-Mar-1976, 42 y.o.   MRN: [id number]  HPI This is a 42 yo male who presents today with left knee pain x 4 days. 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 Applied ice, took ibuprofen 800 mg x 1 with little relief. Applied heat with tempor  Eczema significantly improved.    Past Medical History:  Diagnosis Date  . Eczema   . Glaucoma    Past Surgical History:  Procedure Laterality Date  . ANKLE SURGERY     left 1999   Family History  Problem Relation Age of Onset  . Diabetes Father   . Hypertension Other   . Hypertension Mother   . Hypertension Brother    Social History   Tobacco Use  . Smoking status: Light Tobacco Smoker    Packs/day: 1.00    Years: 6.00    Pack years: 6.00    Types: Cigarettes  . Smokeless tobacco: Never Used  Substance Use Topics  . Alcohol use: Yes  . Drug use: No      Review of Systems Per HPI    Objective:   Physical Exam  Constitutional: He appears well-developed and well-nourished.  HENT:  Head: Normocephalic and atraumatic.  Eyes: Conjunctivae are normal.  Cardiovascular: Normal rate.  Pulmonary/Chest: Effort normal.  Musculoskeletal:       Left knee: He exhibits decreased range of motion (extension) and swelling (mild patellar with small abraision). He exhibits no effusion, no ecchymosis, normal alignment, no LCL laxity, normal patellar mobility, normal meniscus and no MCL laxity.  Vitals reviewed.    BP 128/82 (BP Location: Left Arm, Patient Position: Sitting, Cuff Size: Large)   Pulse 83   Temp 98.2 F (36.8 C) (Oral)   Ht 5\' 9"  (1.753 m)   Wt 219 lb (99.3 kg)   SpO2 98%   BMI 32.34 kg/m  Wt Readings from Last 3 Encounters:  04/28/18 219 lb (99.3 kg)  03/17/18 224 lb (101.6 kg)  03/13/18 220 lb 12 oz (100.1 kg)        Assessment & Plan:  1. Injury of left knee, initial encounter -  Patient Instructions    Good to see you today  After xray of your knee, you can apply Ace bandage for comfort  I will notify you of results and send in some medication  Elevate knee as much as possible and can apply heat as needed   - DG Knee Complete 4 Views Left; Future - encouraged him to notify his employer since injury happened on the job  Olean Ree, FNP-BC  [facility] at [facility], [facility]  04/29/2018 8:31 AM

## 2018-04-28 NOTE — Patient Instructions (Signed)
Good to see you today  After xray of your knee, you can apply Ace bandage for comfort  I will notify you of results and send in some medication  Elevate knee as much as possible and can apply heat as needed

## 2018-04-28 NOTE — Telephone Encounter (Signed)
Best number [phone]  Pt calling to get results of his xray that was one today

## 2018-04-29 ENCOUNTER — Encounter: Payer: Self-pay | Admitting: Family Medicine

## 2018-07-22 ENCOUNTER — Other Ambulatory Visit: Payer: Self-pay | Admitting: Cardiology

## 2018-07-22 DIAGNOSIS — R079 Chest pain, unspecified: Secondary | ICD-10-CM

## 2018-08-18 ENCOUNTER — Ambulatory Visit: Payer: BLUE CROSS/BLUE SHIELD | Admitting: Family Medicine

## 2018-08-20 ENCOUNTER — Ambulatory Visit (INDEPENDENT_AMBULATORY_CARE_PROVIDER_SITE_OTHER): Payer: BLUE CROSS/BLUE SHIELD | Admitting: Family Medicine

## 2018-08-20 ENCOUNTER — Encounter: Payer: Self-pay | Admitting: Family Medicine

## 2018-08-20 VITALS — BP 110/76 | HR 70 | Temp 98.3°F | Ht 69.0 in | Wt 216.0 lb

## 2018-08-20 DIAGNOSIS — E119 Type 2 diabetes mellitus without complications: Secondary | ICD-10-CM

## 2018-08-20 DIAGNOSIS — L2089 Other atopic dermatitis: Secondary | ICD-10-CM

## 2018-08-20 LAB — POCT GLYCOSYLATED HEMOGLOBIN (HGB A1C): HEMOGLOBIN A1C: 6.1 % — AB (ref 4.0–5.6)

## 2018-08-20 MED ORDER — PREDNISONE 10 MG PO TABS: ORAL_TABLET | ORAL | 0 refills | Status: DC

## 2018-08-20 NOTE — Progress Notes (Signed)
Subjective:    Patient ID: Anthony Carroll, male    DOB: [birth year], 43 y.o.   MRN: [id number]  HPI This is a 43 yo male, accompanied by his wife, who presents today for follow up of newly diagnosed DM type 2 8/19. At that time, he was started on metformin and atorvastatin.  He reports that he has done well on medication.  He checks his blood sugar and it does not run above 140.  They have made changes to eat healthier and he is exercising several times a week.  He continues to have problems with atopic dermatitis of his hands.  He is seeing dermatology.  He was on [place] but his insurance was not covering it and so the dermatologist is looking to put him on a different medication.  His hands are particularly bad at this time of year with the weather and frequent handwashing.  He has follow-up with dermatology in a couple of weeks but is requesting a course of prednisone to decrease redness, flaking, cracking.  Recently treated for open-angle glaucoma at [facility], he is using drops without difficulty and has follow-up later this month.  He denies any chest pain, shortness of breath, abdominal pain, diarrhea, constipation, lower extremity edema. Past Medical History:  Diagnosis Date  . Eczema   . Glaucoma    Past Surgical History:  Procedure Laterality Date  . ANKLE SURGERY     left 1999   Family History  Problem Relation Age of Onset  . Diabetes Father   . Hypertension Other   . Hypertension Mother   . Hypertension Brother    Social History   Tobacco Use  . Smoking status: Former Smoker    Packs/day: 1.00    Years: 6.00    Pack years: 6.00    Types: Cigarettes  . Smokeless tobacco: Never Used  Substance Use Topics  . Alcohol use: Yes  . Drug use: No      Review of Systems    Per HPI Objective:   Physical Exam Vitals signs reviewed.  Constitutional:      General: He is not in acute distress.    Appearance: Normal appearance. He is obese. He is not  ill-appearing, toxic-appearing or diaphoretic.  HENT:     Head: Normocephalic and atraumatic.  Cardiovascular:     Rate and Rhythm: Normal rate and regular rhythm.     Heart sounds: Normal heart sounds.  Pulmonary:     Effort: Pulmonary effort is normal.     Breath sounds: Normal breath sounds.  Skin:    General: Skin is warm and dry.     Comments: Bilateral palmar surfaces of hands with thickened skin, mild generalized erythema, fine scaling, superficial cracking.  No drainage.  Neurological:     Mental Status: He is alert and oriented to person, place, and time.  Psychiatric:        Mood and Affect: Mood normal.        Behavior: Behavior normal.        Thought Content: Thought content normal.        Judgment: Judgment normal.       BP 110/76   Pulse 70   Temp 98.3 F (36.8 C) (Oral)   Ht 5\' 9"  (1.753 m)   Wt 216 lb (98 kg)   SpO2 97%   BMI 31.90 kg/m  Wt Readings from Last 3 Encounters:  08/20/18 216 lb (98 kg)  04/28/18 219 lb (99.3 kg)  03/17/18 224 lb (101.6 kg)   Results for orders placed or performed in visit on 08/20/18  HgB A1c  Result Value Ref Range   Hemoglobin A1C 6.1 (A) 4.0 - 5.6 %   HbA1c POC (<> result, manual entry)     HbA1c, POC (prediabetic range)     HbA1c, POC (controlled diabetic range)         Assessment & Plan:  1. Controlled type 2 diabetes mellitus without complication, without long-term current use of insulin (HCC) -Hemoglobin A1c improved from 6.7-6.1 -Continue current meds and encouraged patient to continue healthy food choices and exercise -Follow-up in 6 months for CPE - HgB A1c - Lipid panel; Future  2. Other atopic dermatitis -Follow-up with dermatology as scheduled - predniSONE (DELTASONE) 10 MG tablet; Take 3 x 3 days, 2 x 3 days, 1 x 3 days  Dispense: 18 tablet; Refill: 0 -Discussed potential for prednisone to raise his blood sugar and encouraged him to watch carbohydrates carefully while taking prednisone, reviewed  other side effects as well   Olean Ree, FNP-BC  [facility], [facility]  08/21/2018 8:26 AM

## 2018-08-20 NOTE — Patient Instructions (Signed)
Great job with your diet and weight loss!  Keep up the good work!  Please follow up in 6 months for your annual exam

## 2018-08-21 ENCOUNTER — Encounter: Payer: Self-pay | Admitting: Family Medicine

## 2018-08-21 ENCOUNTER — Other Ambulatory Visit (INDEPENDENT_AMBULATORY_CARE_PROVIDER_SITE_OTHER): Payer: BLUE CROSS/BLUE SHIELD

## 2018-08-21 DIAGNOSIS — E1169 Type 2 diabetes mellitus with other specified complication: Secondary | ICD-10-CM

## 2018-08-21 DIAGNOSIS — E119 Type 2 diabetes mellitus without complications: Secondary | ICD-10-CM | POA: Diagnosis not present

## 2018-08-21 DIAGNOSIS — E785 Hyperlipidemia, unspecified: Secondary | ICD-10-CM

## 2018-08-21 LAB — LIPID PANEL
Cholesterol: 189 mg/dL (ref 0–200)
HDL: 68.3 mg/dL (ref >39.00)
LDL Cholesterol: 111 mg/dL — ABNORMAL HIGH (ref 0–99)
NONHDL: 121.06
Total CHOL/HDL Ratio: 3
Triglycerides: 51 mg/dL (ref 0.0–149.0)
VLDL: 10.2 mg/dL (ref 0.0–40.0)

## 2018-08-22 MED ORDER — ATORVASTATIN CALCIUM 40 MG PO TABS: 40.0000 mg | ORAL_TABLET | Freq: Every day | ORAL | 3 refills | Status: DC

## 2018-08-22 NOTE — Addendum Note (Signed)
Addended by: Olean Ree B on: 08/22/2018 08:26 AM   Modules accepted: Orders

## 2018-09-10 ENCOUNTER — Ambulatory Visit: Payer: 59 | Admitting: Allergy and Immunology

## 2018-09-10 ENCOUNTER — Other Ambulatory Visit: Payer: Self-pay

## 2018-09-18 ENCOUNTER — Other Ambulatory Visit: Payer: Self-pay | Admitting: Family Medicine

## 2018-09-18 DIAGNOSIS — E119 Type 2 diabetes mellitus without complications: Secondary | ICD-10-CM

## 2018-09-29 ENCOUNTER — Ambulatory Visit: Payer: 59 | Admitting: Allergy and Immunology

## 2018-10-15 ENCOUNTER — Telehealth: Payer: Self-pay

## 2018-10-15 NOTE — Telephone Encounter (Signed)
Called to see if pt needed to reschedule and he said "Not at this time"

## 2019-01-19 ENCOUNTER — Telehealth: Payer: Self-pay | Admitting: Family Medicine

## 2019-01-19 NOTE — Telephone Encounter (Signed)
Patient's wife, Tawanna Solo today and stated that the patient is going to be starting a new job soon and they are requesting a most recent A1C be faxed over to them before he can start.   [employer]  [phone]   Patient's C/B # [phone]

## 2019-01-20 NOTE — Telephone Encounter (Signed)
Yes. That is fine.  

## 2019-01-20 NOTE — Telephone Encounter (Signed)
Ok to do this 

## 2019-01-21 NOTE — Telephone Encounter (Signed)
Result faxed over. Patient's wife advised on her voicemail, ok PER DPR notes on file

## 2019-02-17 ENCOUNTER — Other Ambulatory Visit: Payer: Self-pay | Admitting: Family Medicine

## 2019-02-17 DIAGNOSIS — Z125 Encounter for screening for malignant neoplasm of prostate: Secondary | ICD-10-CM

## 2019-02-17 DIAGNOSIS — E785 Hyperlipidemia, unspecified: Secondary | ICD-10-CM

## 2019-02-17 DIAGNOSIS — E119 Type 2 diabetes mellitus without complications: Secondary | ICD-10-CM

## 2019-02-20 ENCOUNTER — Other Ambulatory Visit: Payer: Self-pay

## 2019-02-25 ENCOUNTER — Encounter: Payer: Self-pay | Admitting: Family Medicine

## 2019-04-11 ENCOUNTER — Other Ambulatory Visit: Payer: Self-pay | Admitting: Family Medicine

## 2019-04-11 DIAGNOSIS — E119 Type 2 diabetes mellitus without complications: Secondary | ICD-10-CM

## 2019-04-14 NOTE — Telephone Encounter (Signed)
Patient needs CPE and lab appointment, this was cancelled by the patient in September. Please schedule when able and then can refill medication

## 2019-04-15 NOTE — Telephone Encounter (Signed)
Left message asking pt to call office  °

## 2019-04-16 NOTE — Telephone Encounter (Signed)
Patient called back. Stated that they are seeing another provider at a different location due to insurance change

## 2019-04-16 NOTE — Telephone Encounter (Signed)
Patient stated that he also has plenty of medication right now.  And has the refill on automatic refill

## 2019-05-11 ENCOUNTER — Other Ambulatory Visit: Payer: Self-pay | Admitting: Family Medicine

## 2019-05-11 DIAGNOSIS — E119 Type 2 diabetes mellitus without complications: Secondary | ICD-10-CM

## 2019-05-13 NOTE — Telephone Encounter (Signed)
Pt is almost out of med; pt last seen 08/2018 and pt is scheduling a CPX with Glenda Chroman FNP now with front desk. Refilled per protocol x 1.

## 2019-07-01 ENCOUNTER — Other Ambulatory Visit: Payer: Self-pay | Admitting: Family Medicine

## 2019-07-01 DIAGNOSIS — E119 Type 2 diabetes mellitus without complications: Secondary | ICD-10-CM

## 2019-07-22 ENCOUNTER — Telehealth: Payer: Self-pay

## 2019-07-22 NOTE — Telephone Encounter (Signed)
  LVM w COVID screen, front door and back lab info 2.3.2021 TLJ 

## 2019-07-24 ENCOUNTER — Other Ambulatory Visit (INDEPENDENT_AMBULATORY_CARE_PROVIDER_SITE_OTHER): Payer: Self-pay

## 2019-07-24 ENCOUNTER — Other Ambulatory Visit: Payer: Self-pay

## 2019-07-24 DIAGNOSIS — Z125 Encounter for screening for malignant neoplasm of prostate: Secondary | ICD-10-CM

## 2019-07-24 DIAGNOSIS — E785 Hyperlipidemia, unspecified: Secondary | ICD-10-CM

## 2019-07-24 DIAGNOSIS — E119 Type 2 diabetes mellitus without complications: Secondary | ICD-10-CM

## 2019-07-24 LAB — MICROALBUMIN / CREATININE URINE RATIO
Creatinine,U: 145.2 mg/dL
Microalb Creat Ratio: 4.3 mg/g (ref 0.0–30.0)
Microalb, Ur: 6.3 mg/dL — ABNORMAL HIGH (ref 0.0–1.9)

## 2019-07-24 LAB — HEMOGLOBIN A1C: Hgb A1c MFr Bld: 6.4 % (ref 4.6–6.5)

## 2019-07-24 LAB — PSA: PSA: 0.76 ng/mL (ref 0.10–4.00)

## 2019-07-29 ENCOUNTER — Encounter: Payer: BLUE CROSS/BLUE SHIELD | Admitting: Family Medicine

## 2019-08-12 ENCOUNTER — Other Ambulatory Visit: Payer: Self-pay

## 2019-08-12 ENCOUNTER — Encounter: Payer: Self-pay | Admitting: Family Medicine

## 2019-08-12 ENCOUNTER — Ambulatory Visit (INDEPENDENT_AMBULATORY_CARE_PROVIDER_SITE_OTHER): Payer: 59 | Admitting: Family Medicine

## 2019-08-12 VITALS — BP 122/82 | HR 80 | Temp 98.4°F | Ht 69.0 in | Wt 201.4 lb

## 2019-08-12 DIAGNOSIS — Z0000 Encounter for general adult medical examination without abnormal findings: Secondary | ICD-10-CM

## 2019-08-12 DIAGNOSIS — E119 Type 2 diabetes mellitus without complications: Secondary | ICD-10-CM

## 2019-08-12 DIAGNOSIS — E663 Overweight: Secondary | ICD-10-CM | POA: Diagnosis not present

## 2019-08-12 DIAGNOSIS — Z6829 Body mass index (BMI) 29.0-29.9, adult: Secondary | ICD-10-CM | POA: Diagnosis not present

## 2019-08-12 NOTE — Progress Notes (Signed)
Subjective:    Patient ID: Anthony Carroll, male    DOB: [birth year], 44 y.o.   MRN: [id number]  HPI Chief Complaint  Patient presents with  . Annual Exam    No new concerns   Working on site for [employer]. Has been doing well through pandemic. No concerns today.   Last CPE- last year PSA- normal Tdap- 05/12/2015 Dental- regular Eye- regular Exercise- home exercises several times a week Diet- mostly cooks at home, doesn't usually eat breakfast, drinks coffee, brings lunch to work- left overs, eats varied diet for dinner. Little snacking. Sweet tea occasionally. No soda. Juice every night. Wife very conscientious about food choices, recently got a juicer.    DM- taking metformin 500 mg po bid. Does not check home blood sugars. Hgba1c 6.4 prior to today's visit. Has lost about 25 pounds in last 2 years.   Review of Systems  Constitutional: Negative.   HENT: Negative.   Eyes: Negative.   Respiratory: Negative.   Cardiovascular: Negative.   Gastrointestinal: Negative.   Endocrine: Negative.   Genitourinary: Negative.   Musculoskeletal: Negative.   Skin: Negative.   Allergic/Immunologic: Negative.   Neurological: Negative.   Hematological: Negative.   Psychiatric/Behavioral: Negative.        Objective:   Physical Exam Physical Exam  Constitutional: He is oriented to person, place, and time. He appears well-developed and well-nourished.  HENT:  Head: Normocephalic and atraumatic.  Right Ear: External ear normal.  Left Ear: External ear normal.  Nose: Nose normal.  Mouth/Throat: Oropharynx is clear and moist.  Eyes: Conjunctivae are normal. Pupils are equal, round, and reactive to light.  Neck: Normal range of motion. Neck supple.  Cardiovascular: Normal rate, regular rhythm, normal heart sounds and intact distal pulses.   Pulmonary/Chest: Effort normal and breath sounds normal.  Abdominal: Soft. Bowel sounds are normal.    Musculoskeletal: Normal range of motion. He  exhibits no edema or tenderness.       Cervical back: Normal.       Thoracic back: Normal.       Lumbar back: Normal.  Lymphadenopathy:    He has no cervical adenopathy.       Right: No inguinal adenopathy present.       Left: No inguinal adenopathy present.  Neurological: He is alert and oriented to person, place, and time. He has normal reflexes.  Skin: Skin is warm and dry.  Psychiatric: He has a normal mood and affect. His behavior is normal. Judgment normal.  Vitals reviewed.     BP 122/82 (BP Location: Left Arm, Patient Position: Sitting, Cuff Size: Normal)   Pulse 80   Temp 98.4 F (36.9 C) (Temporal)   Ht 5\' 9"  (1.753 m)   Wt 201 lb 6.4 oz (91.4 kg)   SpO2 98%   BMI 29.74 kg/m  Wt Readings from Last 3 Encounters:  08/12/19 201 lb 6.4 oz (91.4 kg)  08/20/18 216 lb (98 kg)  04/28/18 219 lb (99.3 kg)   Depression screen [facility] 2/9 08/12/2019 02/14/2018 02/14/2018  Decreased Interest 0 0 0  Down, Depressed, Hopeless 0 0 0  PHQ - 2 Score 0 0 0       Assessment & Plan:  1. Annual physical exam - Discussed and encouraged healthy lifestyle choices- adequate sleep, regular exercise, stress management and healthy food choices.    2. Controlled type 2 diabetes mellitus without complication, without long-term current use of insulin (HCC) - hgba1c slightly elevated from prior reading but  still at goal - encouraged him to avoid concentrated sugars in juice and provided him with balanced diet guidelines  3. Overweight with body mass index (BMI) of 29 to 29.9 in adult - has worked on weight loss with good results over last 2 years, encouraged him to continue to make healthy food choices and exercise regularly  This visit occurred during the SARS-CoV-2 public health emergency.  Safety protocols were in place, including screening questions prior to the visit, additional usage of staff PPE, and extensive cleaning of exam room while observing appropriate contact time as indicated for  disinfecting solutions.    This visit occurred during the SARS-CoV-2 public health emergency.  Safety protocols were in place, including screening questions prior to the visit, additional usage of staff PPE, and extensive cleaning of exam room while observing appropriate contact time as indicated for disinfecting solutions.     Clarene Reamer, FNP-BC  [facility] at [facility], [facility]  08/16/2019 12:36 PM

## 2019-08-12 NOTE — Patient Instructions (Signed)
Please follow up in 6 months  A resource that I like is [email or web address].com/diabetes/diet  Here are some guidelines to help you with meal planning -  Avoid all processed and packaged foods (bread, pasta, crackers, chips, etc) and beverages containing calories.  Avoid added sugars and excessive natural sugars.  Attention to how you feel if you consume artificial sweeteners.  Do they make you more hungry or raise your blood sugar?  With every meal and snack, aim to get 20 g of protein (3 ounces of meat, 4 ounces of fish, 3 eggs, protein powder, 1 cup [place] yogurt, 1 cup cottage cheese, etc.)  Increase fiber in the form of non-starchy vegetables.  These help you feel full with very little carbohydrates and are good for gut health.  Eat 1 serving healthy carb per meal- 1/2 cup brown rice, beans, potato, corn- pay attention to whether or not this significantly raises your blood sugar. If it does, reduce the frequency you consume these.   Eat 2-3 servings of lower sugar fruits daily.  This includes berries, apples, oranges, peaches, pears, one half banana.  Have small amounts of good fats such as avocado, nuts, olive oil, nut butters, olives.  Add a little cheese to your salads to make them tasty.    Health Maintenance, Male Adopting a healthy lifestyle and getting preventive care are important in promoting health and wellness. Ask your health care provider about:  The right schedule for you to have regular tests and exams.  Things you can do on your own to prevent diseases and keep yourself healthy. What should I know about diet, weight, and exercise? Eat a healthy diet   Eat a diet that includes plenty of vegetables, fruits, low-fat dairy products, and lean protein.  Do not eat a lot of foods that are high in solid fats, added sugars, or sodium. Maintain a healthy weight Body mass index (BMI) is a measurement that can be used to identify possible weight problems. It estimates body fat  based on height and weight. Your health care provider can help determine your BMI and help you achieve or maintain a healthy weight. Get regular exercise Get regular exercise. This is one of the most important things you can do for your health. Most adults should:  Exercise for at least 150 minutes each week. The exercise should increase your heart rate and make you sweat (moderate-intensity exercise).  Do strengthening exercises at least twice a week. This is in addition to the moderate-intensity exercise.  Spend less time sitting. Even light physical activity can be beneficial. Watch cholesterol and blood lipids Have your blood tested for lipids and cholesterol at 45 years of age, then have this test every 5 years. You may need to have your cholesterol levels checked more often if:  Your lipid or cholesterol levels are high.  You are older than 45 years of age.  You are at high risk for heart disease. What should I know about cancer screening? Many types of cancers can be detected early and may often be prevented. Depending on your health history and family history, you may need to have cancer screening at various ages. This may include screening for:  Colorectal cancer.  Prostate cancer.  Skin cancer.  Lung cancer. What should I know about heart disease, diabetes, and high blood pressure? Blood pressure and heart disease  High blood pressure causes heart disease and increases the risk of stroke. This is more likely to develop in people who have high  blood pressure readings, are of African descent, or are overweight.  Talk with your health care provider about your target blood pressure readings.  Have your blood pressure checked: ? Every 3-5 years if you are 81-71 years of age. ? Every year if you are 2 years old or older.  If you are between the ages of 3 and 80 and are a current or former smoker, ask your health care provider if you should have a one-time screening for  abdominal aortic aneurysm (AAA). Diabetes Have regular diabetes screenings. This checks your fasting blood sugar level. Have the screening done:  Once every three years after age 9 if you are at a normal weight and have a low risk for diabetes.  More often and at a younger age if you are overweight or have a high risk for diabetes. What should I know about preventing infection? Hepatitis B If you have a higher risk for hepatitis B, you should be screened for this virus. Talk with your health care provider to find out if you are at risk for hepatitis B infection. Hepatitis C Blood testing is recommended for:  Everyone born from [birth year] through 1966.  Anyone with known risk factors for hepatitis C. Sexually transmitted infections (STIs)  You should be screened each year for STIs, including gonorrhea and chlamydia, if: ? You are sexually active and are younger than 45 years of age. ? You are older than 45 years of age and your health care provider tells you that you are at risk for this type of infection. ? Your sexual activity has changed since you were last screened, and you are at increased risk for chlamydia or gonorrhea. Ask your health care provider if you are at risk.  Ask your health care provider about whether you are at high risk for HIV. Your health care provider may recommend a prescription medicine to help prevent HIV infection. If you choose to take medicine to prevent HIV, you should first get tested for HIV. You should then be tested every 3 months for as long as you are taking the medicine. Follow these instructions at home: Lifestyle  Do not use any products that contain nicotine or tobacco, such as cigarettes, e-cigarettes, and chewing tobacco. If you need help quitting, ask your health care provider.  Do not use street drugs.  Do not share needles.  Ask your health care provider for help if you need support or information about quitting drugs. Alcohol use  Do not  drink alcohol if your health care provider tells you not to drink.  If you drink alcohol: ? Limit how much you have to 0-2 drinks a day. ? Be aware of how much alcohol is in your drink. In the [place] one drink equals one 12 oz bottle of beer (355 mL), one 5 oz glass of wine (148 mL), or one 1 oz glass of hard liquor (44 mL). General instructions  Schedule regular health, dental, and eye exams.  Stay current with your vaccines.  Tell your health care provider if: ? You often feel depressed. ? You have ever been abused or do not feel safe at home. Summary  Adopting a healthy lifestyle and getting preventive care are important in promoting health and wellness.  Follow your health care provider's instructions about healthy diet, exercising, and getting tested or screened for diseases.  Follow your health care provider's instructions on monitoring your cholesterol and blood pressure. This information is not intended to replace advice given to you  by your health care provider. Make sure you discuss any questions you have with your health care provider. Document Revised: 05/28/2018 Document Reviewed: 05/28/2018 Elsevier Patient Education  2020 [employer].

## 2019-08-16 ENCOUNTER — Encounter: Payer: Self-pay | Admitting: Family Medicine

## 2019-08-16 DIAGNOSIS — E663 Overweight: Secondary | ICD-10-CM | POA: Insufficient documentation

## 2019-08-16 DIAGNOSIS — E119 Type 2 diabetes mellitus without complications: Secondary | ICD-10-CM | POA: Insufficient documentation

## 2019-10-11 ENCOUNTER — Other Ambulatory Visit: Payer: Self-pay | Admitting: Family Medicine

## 2019-10-11 DIAGNOSIS — E119 Type 2 diabetes mellitus without complications: Secondary | ICD-10-CM

## 2019-10-20 ENCOUNTER — Ambulatory Visit (INDEPENDENT_AMBULATORY_CARE_PROVIDER_SITE_OTHER): Payer: 59 | Admitting: Family Medicine

## 2019-10-20 ENCOUNTER — Encounter: Payer: Self-pay | Admitting: Family Medicine

## 2019-10-20 ENCOUNTER — Other Ambulatory Visit: Payer: Self-pay

## 2019-10-20 DIAGNOSIS — T148XXA Other injury of unspecified body region, initial encounter: Secondary | ICD-10-CM

## 2019-10-20 LAB — PROTIME-INR
INR: 1 ratio (ref 0.8–1.0)
Prothrombin Time: 11 s (ref 9.6–13.1)

## 2019-10-20 LAB — CBC WITH DIFFERENTIAL/PLATELET
Basophils Absolute: 0 10*3/uL (ref 0.0–0.1)
Basophils Relative: 0.7 % (ref 0.0–3.0)
Eosinophils Absolute: 0.1 10*3/uL (ref 0.0–0.7)
Eosinophils Relative: 2.9 % (ref 0.0–5.0)
HCT: 44.7 % (ref 39.0–52.0)
Hemoglobin: 14.7 g/dL (ref 13.0–17.0)
Lymphocytes Relative: 24.1 % (ref 12.0–46.0)
Lymphs Abs: 1.2 10*3/uL (ref 0.7–4.0)
MCHC: 33 g/dL (ref 30.0–36.0)
MCV: 89.9 fl (ref 78.0–100.0)
Monocytes Absolute: 0.6 10*3/uL (ref 0.1–1.0)
Monocytes Relative: 12.3 % — ABNORMAL HIGH (ref 3.0–12.0)
Neutro Abs: 3 10*3/uL (ref 1.4–7.7)
Neutrophils Relative %: 60 % (ref 43.0–77.0)
Platelets: 203 10*3/uL (ref 150.0–400.0)
RBC: 4.97 Mil/uL (ref 4.22–5.81)
RDW: 16 % — ABNORMAL HIGH (ref 11.5–15.5)
WBC: 5 10*3/uL (ref 4.0–10.5)

## 2019-10-20 NOTE — Patient Instructions (Signed)
I think you have a resolving bruise (hematoma) and I do not see other areas  ? Trauma from massage or something you don't remember  Try a warm compress if it bothers you   Watch for enlargement or change in size and shape This should improve (the knot will take longer than the bruise)   Be careful not to injure it   Lab now- for blood count/platelets/protime (these are bleeding tests)

## 2019-10-20 NOTE — Assessment & Plan Note (Signed)
Suspect caused by massage (self/wife) but pt is not entirely sure (nothing else to explain) Disc use of warm compresses Protect from trauma  Should improve - inst to call if larger or more pain  Cbc with platelet and PT drawn today  No h/o asa or anticoag use

## 2019-10-20 NOTE — Progress Notes (Signed)
Subjective:    Patient ID: Anthony Carroll, male    DOB: 03/11/1976, 44 y.o.   MRN: [id number]  This visit occurred during the SARS-CoV-2 public health emergency.  Safety protocols were in place, including screening questions prior to the visit, additional usage of staff PPE, and extensive cleaning of exam room while observing appropriate contact time as indicated for disinfecting solutions.    HPI  44 yo pt of NP Leone Payor presents with bruise on L side   Wt Readings from Last 3 Encounters:  10/20/19 202 lb 7 oz (91.8 kg)  08/12/19 201 lb 6.4 oz (91.4 kg)  08/20/18 216 lb (98 kg)   29.89 kg/m   Has a large bruise on L side  No injury known  Is bigger than yesterday  Sore to the touch  No abdominal pain  No h/o bleeding disorder or hematologic problem   Does not usually bruise easily   He has a tight area in that part of his back that he routinely massages/wife massages -? If this could cause trauma   Exercise-push ups/sit ups and stretches  No contact sports   etoh - on average 2 drinks on the weekends   No asa  No blood thinners   Patient Active Problem List   Diagnosis Date Noted  . Hematoma 10/20/2019  . Controlled type 2 diabetes mellitus without complication, without long-term current use of insulin (HCC) 08/16/2019  . Overweight with body mass index (BMI) of 29 to 29.9 in adult 08/16/2019  . Elevated lipids 02/15/2018  . Seasonal and perennial allergic rhinitis 09/02/2017  . Allergic conjunctivitis 09/02/2017  . Oral allergy syndrome 09/02/2017  . Atopic dermatitis 09/02/2017  . Chest pain, unspecified 04/24/2017  . Tobacco abuse 04/24/2017   Past Medical History:  Diagnosis Date  . Eczema   . Glaucoma    Past Surgical History:  Procedure Laterality Date  . ANKLE SURGERY     left 1999   Social History   Tobacco Use  . Smoking status: Former Smoker    Packs/day: 1.00    Years: 6.00    Pack years: 6.00    Types: Cigarettes  . Smokeless tobacco:  Never Used  Substance Use Topics  . Alcohol use: Yes  . Drug use: No   Family History  Problem Relation Age of Onset  . Diabetes Father   . Hypertension Other   . Hypertension Mother   . Hypertension Brother    No Known Allergies Current Outpatient Medications on File Prior to Visit  Medication Sig Dispense Refill  . atorvastatin (LIPITOR) 40 MG tablet Take 1 tablet (40 mg total) by mouth daily. 90 tablet 3  . cetirizine (ZYRTEC) 10 MG tablet Take 10 mg by mouth daily.    Marland Kitchen desonide (DESOWEN) 0.05 % ointment Apply 1 application topically 2 (two) times daily. 15 g 5  . dorzolamide-timolol (COSOPT) 22.3-6.8 MG/ML ophthalmic solution Place 1 drop into both eyes 2 (two) times daily.    . metFORMIN (GLUCOPHAGE) 500 MG tablet TAKE ONE TABLET WITH DINNER FOR A WEEK THEN TAKE ONE TABLET WITH BREAKFAST AND DINNER DAILY. 180 tablet 1  . Multiple Vitamins-Minerals (MULTIVITAMIN WITH MINERALS) tablet Take 1 tablet by mouth daily.    . Olopatadine HCl (PAZEO) 0.7 % SOLN Place 1 drop into both eyes 1 day or 1 dose. 1 Bottle 5  . triamcinolone cream (KENALOG) 0.1 %   3   No current facility-administered medications on file prior to visit.    Review  of Systems  Constitutional: Negative for activity change, appetite change, fatigue, fever and unexpected weight change.  HENT: Negative for congestion, rhinorrhea, sore throat and trouble swallowing.   Eyes: Negative for pain, redness, itching and visual disturbance.  Respiratory: Negative for cough, chest tightness, shortness of breath and wheezing.   Cardiovascular: Negative for chest pain and palpitations.  Gastrointestinal: Negative for abdominal pain, blood in stool, constipation, diarrhea and nausea.  Endocrine: Negative for cold intolerance, heat intolerance, polydipsia and polyuria.  Genitourinary: Negative for difficulty urinating, dysuria, frequency and urgency.  Musculoskeletal: Negative for arthralgias, joint swelling and myalgias.  Skin:  Negative for pallor and rash.       Large bruise on his R side that is a little tender  Neurological: Negative for dizziness, tremors, weakness, numbness and headaches.  Hematological: Negative for adenopathy. Does not bruise/bleed easily.  Psychiatric/Behavioral: Negative for decreased concentration and dysphoric mood. The patient is not nervous/anxious.        Objective:   Physical Exam Constitutional:      General: He is not in acute distress.    Appearance: Normal appearance. He is normal weight. He is not ill-appearing.  HENT:     Head: Normocephalic and atraumatic.  Eyes:     General: No scleral icterus.    Pupils: Pupils are equal, round, and reactive to light.  Cardiovascular:     Rate and Rhythm: Regular rhythm. Tachycardia present.     Pulses: Normal pulses.     Heart sounds: Normal heart sounds.  Pulmonary:     Effort: Pulmonary effort is normal. No respiratory distress.     Breath sounds: Normal breath sounds. No wheezing or rales.     Comments: No cw tenderness or crepitus Chest:     Chest wall: No tenderness.  Musculoskeletal:        General: No swelling or deformity.     Cervical back: Normal range of motion.     Comments: No acute joint changes  Lymphadenopathy:     Cervical: No cervical adenopathy.  Skin:    General: Skin is warm and dry.     Comments: Area of ecchymosis on L flank - 6 cm by 13 cm irregular shaped with small amt of firmness in the center  Purple in color/edges are faded  Slightly tender  No other bruises on skin/body  Multiple old scars on legs from old injuries but no ecchymoses   Neurological:     Mental Status: He is alert.     Sensory: No sensory deficit.  Psychiatric:        Mood and Affect: Mood normal.           Assessment & Plan:   Problem List Items Addressed This Visit      Other   Hematoma    Suspect caused by massage (self/wife) but pt is not entirely sure (nothing else to explain) Disc use of warm  compresses Protect from trauma  Should improve - inst to call if larger or more pain  Cbc with platelet and PT drawn today  No h/o asa or anticoag use        Relevant Orders   CBC with Differential/Platelet   Protime-INR

## 2020-02-12 ENCOUNTER — Encounter: Payer: Self-pay | Admitting: Family Medicine

## 2020-02-12 ENCOUNTER — Other Ambulatory Visit: Payer: Self-pay

## 2020-02-12 ENCOUNTER — Ambulatory Visit (INDEPENDENT_AMBULATORY_CARE_PROVIDER_SITE_OTHER): Payer: 59 | Admitting: Family Medicine

## 2020-02-12 VITALS — BP 122/82 | HR 85 | Temp 98.1°F | Ht 69.0 in | Wt 198.5 lb

## 2020-02-12 DIAGNOSIS — E1169 Type 2 diabetes mellitus with other specified complication: Secondary | ICD-10-CM | POA: Diagnosis not present

## 2020-02-12 DIAGNOSIS — E785 Hyperlipidemia, unspecified: Secondary | ICD-10-CM | POA: Diagnosis not present

## 2020-02-12 DIAGNOSIS — Z720 Tobacco use: Secondary | ICD-10-CM

## 2020-02-12 DIAGNOSIS — E119 Type 2 diabetes mellitus without complications: Secondary | ICD-10-CM

## 2020-02-12 NOTE — Patient Instructions (Signed)
Good to see you today  I will send you a message with your results  Continue to work on smoking cessation  Follow up for your physical in 6 months

## 2020-02-12 NOTE — Progress Notes (Signed)
   Subjective:    Patient ID: Anthony Carroll, male    DOB: 26-Apr-1976, 44 y.o.   MRN: [id number]  HPI Chief Complaint  Patient presents with  . Diabetes    6 month f/u   This is a 44 yo male who presents today for follow up of DM. Eye exam last week.   Has been doing well. Has been checking his blood sugars at home. Range 88-112. Has been doing home exercise.   Tob abuse- smoking 6 cigarettes a day. Reducing gradually.   Covid vaccine- hasn't had yet.   Review of Systems Denies headache, visual change, chest pain, shortness of breath, abdominal pain, constipation/diarrhea, leg swelling    Objective:   Physical Exam Physical Exam  Constitutional: Oriented to person, place, and time. Appears well-developed and well-nourished.  HENT:  Head: Normocephalic and atraumatic.  Eyes: Conjunctivae are normal.  Neck: Normal range of motion. Neck supple.  Cardiovascular: Normal rate, regular rhythm and normal heart sounds.   Pulmonary/Chest: Effort normal and breath sounds normal.  Musculoskeletal: No lower extremity edema.   Neurological: Alert and oriented to person, place, and time.  Skin: Skin is warm and dry.  Psychiatric: Normal mood and affect. Behavior is normal. Judgment and thought content normal.  Vitals reviewed.    BP 122/82   Pulse 85   Temp 98.1 F (36.7 C) (Temporal)   Ht 5\' 9"  (1.753 m)   Wt 198 lb 8 oz (90 kg)   SpO2 97%   BMI 29.31 kg/m  Wt Readings from Last 3 Encounters:  02/12/20 198 lb 8 oz (90 kg)  10/20/19 202 lb 7 oz (91.8 kg)  08/12/19 201 lb 6.4 oz (91.4 kg)       Assessment & Plan:  1. Controlled type 2 diabetes mellitus without complication, without long-term current use of insulin (HCC) - Lipid Panel - Hemoglobin A1C  2. Tobacco abuse -He has been decreasing his consumption of cigarettes and I encouraged him to continue and to set a goal for quitting  -Follow-up in 6 months for CPE  This visit occurred during the SARS-CoV-2 public health  emergency.  Safety protocols were in place, including screening questions prior to the visit, additional usage of staff PPE, and extensive cleaning of exam room while observing appropriate contact time as indicated for disinfecting solutions.      08/14/19, FNP-BC  [facility] at [facility], [facility]  02/14/2020 8:15 AM

## 2020-02-13 LAB — LIPID PANEL
Cholesterol: 248 mg/dL — ABNORMAL HIGH (ref <200)
HDL: 80 mg/dL (ref >=40)
LDL Cholesterol (Calc): 149 mg/dL (calc) — ABNORMAL HIGH
Non-HDL Cholesterol (Calc): 168 mg/dL (calc) — ABNORMAL HIGH (ref <130)
Total CHOL/HDL Ratio: 3.1 (calc) (ref <5.0)
Triglycerides: 87 mg/dL (ref <150)

## 2020-02-13 LAB — HEMOGLOBIN A1C
Hgb A1c MFr Bld: 5.8 % of total Hgb — ABNORMAL HIGH (ref <5.7)
Mean Plasma Glucose: 120 (calc)
eAG (mmol/L): 6.6 (calc)

## 2020-02-15 MED ORDER — ATORVASTATIN CALCIUM 20 MG PO TABS: 20.0000 mg | ORAL_TABLET | Freq: Every evening | ORAL | 3 refills | Status: DC

## 2020-02-15 NOTE — Addendum Note (Signed)
Addended by: Olean Ree B on: 02/15/2020 09:09 AM   Modules accepted: Orders

## 2020-04-19 ENCOUNTER — Other Ambulatory Visit: Payer: Self-pay | Admitting: Family Medicine

## 2020-04-19 DIAGNOSIS — E119 Type 2 diabetes mellitus without complications: Secondary | ICD-10-CM

## 2020-04-19 NOTE — Telephone Encounter (Signed)
Pharmacy requests refill on: Metformin 200 mg BID   LAST REFILL: 10/12/2019 LAST OV: 02/12/2020 NEXT OV: 08/12/2020 PHARMACY: [facility]   Last Hemoglobin A1C was obtained on 02/12/2020 and was 5.8.

## 2020-07-20 ENCOUNTER — Telehealth: Payer: Self-pay | Admitting: *Deleted

## 2020-07-20 NOTE — Telephone Encounter (Signed)
Patient left a voicemail stating that [facility] told him that our office never responded to a refill request. Patient requested a call back to discuss. Called patient back and got his voicemail. Left a message for patient to call the office back. Need name of medication that needs to be refilled. Patient has a TOC appointment scheduled with Dr. Neomia Glass 08/09/20.

## 2020-07-20 NOTE — Telephone Encounter (Signed)
Patient called back and stated that he can not get a refill on his cholesterol medication. Patient was advised that a new script was sent in for him 02/15/20 #90/3. Patient stated that may be the problem because he may be trying to get a refill on an old prescription. Patient stated that he will call the pharmacy and let them know they should have a script on hold for him for atorvastatin. Advised patient to let us know if he has a problem getting the medication.

## 2020-08-01 ENCOUNTER — Telehealth: Payer: Self-pay | Admitting: Family Medicine

## 2020-08-01 DIAGNOSIS — Z125 Encounter for screening for malignant neoplasm of prostate: Secondary | ICD-10-CM

## 2020-08-01 DIAGNOSIS — E119 Type 2 diabetes mellitus without complications: Secondary | ICD-10-CM

## 2020-08-01 DIAGNOSIS — Z1159 Encounter for screening for other viral diseases: Secondary | ICD-10-CM

## 2020-08-01 NOTE — Telephone Encounter (Signed)
-----   Message from Aquilla Solian, RT sent at 07/25/2020  2:13 PM EST ----- Regarding: Lab Orders for Friday 2.25.2022 Please place lab orders for Friday 2.25.2022, office visit for physical on Friday 3.4.2022 Thank you, Jones Bales RT(R)

## 2020-08-09 ENCOUNTER — Ambulatory Visit: Payer: 59 | Admitting: Family Medicine

## 2020-08-12 ENCOUNTER — Other Ambulatory Visit (INDEPENDENT_AMBULATORY_CARE_PROVIDER_SITE_OTHER): Payer: 59

## 2020-08-12 ENCOUNTER — Other Ambulatory Visit: Payer: Self-pay

## 2020-08-12 ENCOUNTER — Encounter: Payer: 59 | Admitting: Family Medicine

## 2020-08-12 DIAGNOSIS — Z1159 Encounter for screening for other viral diseases: Secondary | ICD-10-CM

## 2020-08-12 DIAGNOSIS — Z125 Encounter for screening for malignant neoplasm of prostate: Secondary | ICD-10-CM

## 2020-08-12 DIAGNOSIS — E119 Type 2 diabetes mellitus without complications: Secondary | ICD-10-CM | POA: Diagnosis not present

## 2020-08-12 LAB — MICROALBUMIN / CREATININE URINE RATIO
Creatinine,U: 166.3 mg/dL
Microalb Creat Ratio: 4.5 mg/g (ref 0.0–30.0)
Microalb, Ur: 7.5 mg/dL — ABNORMAL HIGH (ref 0.0–1.9)

## 2020-08-12 LAB — COMPREHENSIVE METABOLIC PANEL
ALT: 22 U/L (ref 0–53)
AST: 22 U/L (ref 0–37)
Albumin: 4.6 g/dL (ref 3.5–5.2)
Alkaline Phosphatase: 51 U/L (ref 39–117)
BUN: 16 mg/dL (ref 6–23)
CO2: 28 mEq/L (ref 19–32)
Calcium: 9.7 mg/dL (ref 8.4–10.5)
Chloride: 103 mEq/L (ref 96–112)
Creatinine, Ser: 1.09 mg/dL (ref 0.40–1.50)
GFR: 81.74 mL/min (ref >60.00)
Glucose, Bld: 102 mg/dL — ABNORMAL HIGH (ref 70–99)
Potassium: 4.4 mEq/L (ref 3.5–5.1)
Sodium: 138 mEq/L (ref 135–145)
Total Bilirubin: 0.6 mg/dL (ref 0.2–1.2)
Total Protein: 7.8 g/dL (ref 6.0–8.3)

## 2020-08-12 LAB — LIPID PANEL
Cholesterol: 176 mg/dL (ref 0–200)
HDL: 66.6 mg/dL (ref >39.00)
LDL Cholesterol: 97 mg/dL (ref 0–99)
NonHDL: 108.96
Total CHOL/HDL Ratio: 3
Triglycerides: 58 mg/dL (ref 0.0–149.0)
VLDL: 11.6 mg/dL (ref 0.0–40.0)

## 2020-08-12 LAB — PSA: PSA: 0.93 ng/mL (ref 0.10–4.00)

## 2020-08-12 LAB — HEMOGLOBIN A1C: Hgb A1c MFr Bld: 6.4 % (ref 4.6–6.5)

## 2020-08-12 NOTE — Progress Notes (Signed)
No critical labs need to be addressed urgently. We will discuss labs in detail at upcoming office visit.   

## 2020-08-15 LAB — HEPATITIS C ANTIBODY
Hepatitis C Ab: NONREACTIVE
SIGNAL TO CUT-OFF: 0.01 (ref <1.00)

## 2020-08-16 NOTE — Progress Notes (Signed)
No critical labs need to be addressed urgently. We will discuss labs in detail at upcoming office visit.   

## 2020-08-19 ENCOUNTER — Encounter: Payer: 59 | Admitting: Family Medicine

## 2020-08-29 LAB — HM DIABETES FOOT EXAM

## 2020-09-13 ENCOUNTER — Ambulatory Visit (INDEPENDENT_AMBULATORY_CARE_PROVIDER_SITE_OTHER): Payer: 59 | Admitting: Family Medicine

## 2020-09-13 ENCOUNTER — Encounter: Payer: Self-pay | Admitting: Family Medicine

## 2020-09-13 ENCOUNTER — Other Ambulatory Visit: Payer: Self-pay

## 2020-09-13 VITALS — BP 140/90 | HR 83 | Temp 98.6°F | Ht 69.0 in | Wt 209.5 lb

## 2020-09-13 DIAGNOSIS — Z1211 Encounter for screening for malignant neoplasm of colon: Secondary | ICD-10-CM

## 2020-09-13 DIAGNOSIS — E1169 Type 2 diabetes mellitus with other specified complication: Secondary | ICD-10-CM

## 2020-09-13 DIAGNOSIS — T781XXD Other adverse food reactions, not elsewhere classified, subsequent encounter: Secondary | ICD-10-CM

## 2020-09-13 DIAGNOSIS — E785 Hyperlipidemia, unspecified: Secondary | ICD-10-CM

## 2020-09-13 DIAGNOSIS — J3089 Other allergic rhinitis: Secondary | ICD-10-CM | POA: Diagnosis not present

## 2020-09-13 DIAGNOSIS — Z6829 Body mass index (BMI) 29.0-29.9, adult: Secondary | ICD-10-CM

## 2020-09-13 DIAGNOSIS — Z0000 Encounter for general adult medical examination without abnormal findings: Secondary | ICD-10-CM | POA: Diagnosis not present

## 2020-09-13 DIAGNOSIS — E119 Type 2 diabetes mellitus without complications: Secondary | ICD-10-CM

## 2020-09-13 DIAGNOSIS — L2089 Other atopic dermatitis: Secondary | ICD-10-CM | POA: Diagnosis not present

## 2020-09-13 DIAGNOSIS — E663 Overweight: Secondary | ICD-10-CM

## 2020-09-13 NOTE — Assessment & Plan Note (Signed)
Moderately controlled on  zyrtec OTC.

## 2020-09-13 NOTE — Progress Notes (Signed)
Patient ID: Anthony Carroll, male    DOB: 1976-04-05, 45 y.o.   MRN: [id number]  This visit was conducted in person.  BP 140/90   Pulse 83   Temp 98.6 F (37 C) (Temporal)   Ht 5\' 9"  (1.753 m)   Wt 209 lb 8 oz (95 kg)   SpO2 96%   BMI 30.94 kg/m    CC:  Chief Complaint  Patient presents with  . Transitions Of Care  . Annual Exam  . Referral    Opthlamologist in [place]    Subjective:   HPI: Anthony Carroll is a 45 y.o. male presenting on 09/13/2020 for Transitions Of Care, Annual Exam, and Referral (Opthlamologist in [place])   Allergic conjunctivitis , rhinitis and dermatitis history, seasonally:  Moderately controlled on  zyrtec OTC.     Elevated Cholesterol:  Lab Results  Component Value Date   CHOL 176 08/12/2020   HDL 66.60 08/12/2020   LDLCALC 97 08/12/2020   TRIG 58.0 08/12/2020   CHOLHDL 3 08/12/2020  Using medications without problems: none Muscle aches: none Diet compliance: veggies Exercise: working out 3 day as  week Other complaints: Wt Readings from Last 3 Encounters:  09/13/20 209 lb 8 oz (95 kg)  02/12/20 198 lb 8 oz (90 kg)  10/20/19 202 lb 7 oz (91.8 kg)     Diabetes:  Well controlled on metfomrin Using medications without difficulties: Hypoglycemic episodes: none Hyperglycemic episodes: Feet problems: no ulcer Blood Sugars averaging: FBS  eye exam within last year: due   Relevant past medical, surgical, family and social history reviewed and updated as indicated. Interim medical history since our last visit reviewed. Allergies and medications reviewed and updated. Outpatient Medications Prior to Visit  Medication Sig Dispense Refill  . atorvastatin (LIPITOR) 20 MG tablet Take 1 tablet (20 mg total) by mouth every evening. 90 tablet 3  . cetirizine (ZYRTEC) 10 MG tablet Take 10 mg by mouth daily.    12/20/19 desonide (DESOWEN) 0.05 % ointment Apply 1 application topically 2 (two) times daily. 15 g 5  . dorzolamide-timolol (COSOPT) 22.3-6.8  MG/ML ophthalmic solution Place 1 drop into both eyes 2 (two) times daily.    . metFORMIN (GLUCOPHAGE) 500 MG tablet TAKE ONE TABLET WITH DINNER FOR A WEEK THEN TAKE ONE TABLET WITH BREAKFAST AND DINNER DAILY. 180 tablet 0  . Multiple Vitamins-Minerals (MULTIVITAMIN WITH MINERALS) tablet Take 1 tablet by mouth daily.    . Olopatadine HCl (PAZEO) 0.7 % SOLN Place 1 drop into both eyes 1 day or 1 dose. 1 Bottle 5  . triamcinolone cream (KENALOG) 0.1 %   3   No facility-administered medications prior to visit.     Per HPI unless specifically indicated in ROS section below Review of Systems  Constitutional: Negative for fatigue and fever.  HENT: Negative for ear pain.   Eyes: Negative for pain.  Respiratory: Negative for cough and shortness of breath.   Cardiovascular: Negative for chest pain, palpitations and leg swelling.  Gastrointestinal: Negative for abdominal pain.  Genitourinary: Negative for dysuria.  Musculoskeletal: Negative for arthralgias.  Neurological: Negative for syncope, light-headedness and headaches.  Psychiatric/Behavioral: Negative for dysphoric mood.   Objective:  BP 140/90   Pulse 83   Temp 98.6 F (37 C) (Temporal)   Ht 5\' 9"  (1.753 m)   Wt 209 lb 8 oz (95 kg)   SpO2 96%   BMI 30.94 kg/m   Wt Readings from Last 3 Encounters:  09/13/20 209 lb 8  oz (95 kg)  02/12/20 198 lb 8 oz (90 kg)  10/20/19 202 lb 7 oz (91.8 kg)      Physical Exam Constitutional:      General: He is not in acute distress.    Appearance: Normal appearance. He is well-developed. He is not ill-appearing or toxic-appearing.  HENT:     Head: Normocephalic and atraumatic.     Right Ear: Hearing, tympanic membrane, ear canal and external ear normal.     Left Ear: Hearing, tympanic membrane, ear canal and external ear normal.     Nose: Nose normal.     Mouth/Throat:     Pharynx: Uvula midline.  Eyes:     General: Lids are normal. Lids are everted, no foreign bodies appreciated.      Conjunctiva/sclera: Conjunctivae normal.     Pupils: Pupils are equal, round, and reactive to light.  Neck:     Thyroid: No thyroid mass or thyromegaly.     Vascular: No carotid bruit.     Trachea: Trachea and phonation normal.  Cardiovascular:     Rate and Rhythm: Normal rate and regular rhythm.     Pulses: Normal pulses.     Heart sounds: S1 normal and S2 normal. No murmur heard. No gallop.   Pulmonary:     Breath sounds: Normal breath sounds. No wheezing, rhonchi or rales.  Abdominal:     General: Bowel sounds are normal.     Palpations: Abdomen is soft.     Tenderness: There is no abdominal tenderness. There is no guarding or rebound.     Hernia: No hernia is present.  Musculoskeletal:     Cervical back: Normal range of motion and neck supple.  Lymphadenopathy:     Cervical: No cervical adenopathy.  Skin:    General: Skin is warm and dry.     Findings: No rash.  Neurological:     Mental Status: He is alert.     Cranial Nerves: No cranial nerve deficit.     Sensory: No sensory deficit.     Gait: Gait normal.     Deep Tendon Reflexes: Reflexes are normal and symmetric.  Psychiatric:        Speech: Speech normal.        Behavior: Behavior normal.        Judgment: Judgment normal.       Diabetic foot exam: Normal inspection No skin breakdown No calluses  Normal DP pulses Normal sensation to light touch and monofilament Nails normal  Results for orders placed or performed in visit on 08/12/20  PSA  Result Value Ref Range   PSA 0.93 0.10 - 4.00 ng/mL  Hepatitis C antibody  Result Value Ref Range   Hepatitis C Ab NON-REACTIVE NON-REACTI   SIGNAL TO CUT-OFF 0.01 <1.00  Microalbumin / creatinine urine ratio  Result Value Ref Range   Microalb, Ur 7.5 (H) 0.0 - 1.9 mg/dL   Creatinine,U 824.2 mg/dL   Microalb Creat Ratio 4.5 0.0 - 30.0 mg/g  Comprehensive metabolic panel  Result Value Ref Range   Sodium 138 135 - 145 mEq/L   Potassium 4.4 3.5 - 5.1 mEq/L    Chloride 103 96 - 112 mEq/L   CO2 28 19 - 32 mEq/L   Glucose, Bld 102 (H) 70 - 99 mg/dL   BUN 16 6 - 23 mg/dL   Creatinine, Ser 3.53 0.40 - 1.50 mg/dL   Total Bilirubin 0.6 0.2 - 1.2 mg/dL   Alkaline Phosphatase 51 39 -  117 U/L   AST 22 0 - 37 U/L   ALT 22 0 - 53 U/L   Total Protein 7.8 6.0 - 8.3 g/dL   Albumin 4.6 3.5 - 5.2 g/dL   GFR 59.56 >38.75 mL/min   Calcium 9.7 8.4 - 10.5 mg/dL  Lipid panel  Result Value Ref Range   Cholesterol 176 0 - 200 mg/dL   Triglycerides 64.3 0.0 - 149.0 mg/dL   HDL 32.95 >18.84 mg/dL   VLDL 16.6 0.0 - 06.3 mg/dL   LDL Cholesterol 97 0 - 99 mg/dL   Total CHOL/HDL Ratio 3    NonHDL 108.96   Hemoglobin A1c  Result Value Ref Range   Hgb A1c MFr Bld 6.4 4.6 - 6.5 %    This visit occurred during the SARS-CoV-2 public health emergency.  Safety protocols were in place, including screening questions prior to the visit, additional usage of staff PPE, and extensive cleaning of exam room while observing appropriate contact time as indicated for disinfecting solutions.   COVID 19 screen:  No recent travel or known exposure to COVID19 The patient denies respiratory symptoms of COVID 19 at this time. The importance of social distancing was discussed today.   Assessment and Plan   The patient's preventative maintenance and recommended screening tests for an annual wellness exam were reviewed in full today. Brought up to date unless services declined.  Counselled on the importance of diet, exercise, and its role in overall health and mortality. The patient's FH and SH was reviewed, including their home life, tobacco status, and drug and alcohol status.    VOID x 2 done.. due for booster.  Consider pneumonia vaccine PSA normal  Colonoscopy:referral made. Quit smoking Flowsheet Row Office Visit from 09/13/2020 in [facility] Total Score 0     Problem List Items Addressed This Visit    Atopic dermatitis     Uses desonide  often in spring.      Controlled type 2 diabetes mellitus without complication, without long-term current use of insulin (HCC)    Lab Results  Component Value Date   HGBA1C 6.4 08/12/2020   metformin 500 mg BID   Occ lows.. may need to stop metformin and manage with diet.      Relevant Orders   Ambulatory referral to Ophthalmology   Hyperlipidemia associated with type 2 diabetes mellitus (HCC)    Stable, chronic.  Continue current medication.   Atorvastatin 20 mg daily.      Oral allergy syndrome    Hives with some fruits.  Has had allergy testing in past.      Overweight with body mass index (BMI) of 29 to 29.9 in adult    Encouraged exercise, weight loss, healthy eating habits.       Seasonal and perennial allergic rhinitis     Moderately controlled on  zyrtec OTC.        Other Visit Diagnoses    Routine general medical examination at a health care facility    -  Primary   Colon cancer screening       Relevant Orders   Ambulatory referral to Gastroenterology      Kerby Nora, MD

## 2020-09-13 NOTE — Assessment & Plan Note (Signed)
Uses desonide often in spring.

## 2020-09-13 NOTE — Assessment & Plan Note (Addendum)
Lab Results  Component Value Date   HGBA1C 6.4 08/12/2020   metformin 500 mg BID   Occ lows.. may need to stop metformin and manage with diet.

## 2020-09-13 NOTE — Assessment & Plan Note (Signed)
Hives with some fruits.  Has had allergy testing in past.

## 2020-09-13 NOTE — Assessment & Plan Note (Signed)
Stable, chronic.  Continue current medication. ? ? ?Atorvastatin 20 mg daily ?

## 2020-09-13 NOTE — Assessment & Plan Note (Signed)
Encouraged exercise, weight loss, healthy eating habits. ? ?

## 2020-09-13 NOTE — Patient Instructions (Addendum)
Consider pneumonia vaccine. Get COVID vaccine.  Set up yearly eye exam for diabetes and have the opthalmologist send [place] a copy of the evaluation for the chart.  keep working on healthy eating and regular exercise.  If continuing to have low blood sugar.. call.

## 2020-10-05 ENCOUNTER — Encounter: Payer: Self-pay | Admitting: *Deleted

## 2020-11-23 LAB — HM DIABETES EYE EXAM

## 2020-11-24 ENCOUNTER — Encounter: Payer: Self-pay | Admitting: Family Medicine

## 2020-12-13 ENCOUNTER — Encounter: Payer: Self-pay | Admitting: Family Medicine

## 2021-03-17 ENCOUNTER — Ambulatory Visit: Payer: 59 | Admitting: Family Medicine

## 2021-04-06 ENCOUNTER — Telehealth: Payer: Self-pay | Admitting: Family Medicine

## 2021-04-06 DIAGNOSIS — E1169 Type 2 diabetes mellitus with other specified complication: Secondary | ICD-10-CM

## 2021-04-06 DIAGNOSIS — E119 Type 2 diabetes mellitus without complications: Secondary | ICD-10-CM

## 2021-04-06 DIAGNOSIS — E785 Hyperlipidemia, unspecified: Secondary | ICD-10-CM

## 2021-04-06 MED ORDER — METFORMIN HCL 500 MG PO TABS: 500.0000 mg | ORAL_TABLET | Freq: Two times a day (BID) | ORAL | 0 refills | Status: DC

## 2021-04-06 MED ORDER — ATORVASTATIN CALCIUM 20 MG PO TABS: 20.0000 mg | ORAL_TABLET | Freq: Every evening | ORAL | 1 refills | Status: DC

## 2021-04-06 NOTE — Telephone Encounter (Signed)
Please schedule patient for Diabetes follow up with Dr. Ermalene Searing.

## 2021-04-06 NOTE — Telephone Encounter (Signed)
  Encourage patient to contact the pharmacy for refills or they can request refills through [facility]  LAST APPOINTMENT DATE:  Please schedule appointment if longer than 1 year  NEXT APPOINTMENT DATE:  MEDICATION: metformin  Is the patient out of medication? yes  [facility] [place]  Let patient know to contact pharmacy at the end of the day to make sure medication is ready.  Please notify patient to allow 48-72 hours to process  CLINICAL FILLS OUT ALL BELOW:   LAST REFILL:  QTY:  REFILL DATE:    OTHER COMMENTS:    Okay for refill?  Please advise

## 2021-04-07 NOTE — Telephone Encounter (Signed)
Called mr. 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

## 2021-04-13 ENCOUNTER — Ambulatory Visit (INDEPENDENT_AMBULATORY_CARE_PROVIDER_SITE_OTHER): Payer: 59 | Admitting: Family Medicine

## 2021-04-13 ENCOUNTER — Other Ambulatory Visit: Payer: Self-pay

## 2021-04-13 ENCOUNTER — Encounter: Payer: Self-pay | Admitting: Family Medicine

## 2021-04-13 VITALS — BP 138/86 | HR 70 | Temp 97.6°F | Ht 69.0 in | Wt 213.0 lb

## 2021-04-13 DIAGNOSIS — E119 Type 2 diabetes mellitus without complications: Secondary | ICD-10-CM

## 2021-04-13 LAB — POCT GLYCOSYLATED HEMOGLOBIN (HGB A1C): Hemoglobin A1C: 6.2 % — AB (ref 4.0–5.6)

## 2021-04-13 MED ORDER — METFORMIN HCL 500 MG PO TABS: 500.0000 mg | ORAL_TABLET | Freq: Two times a day (BID) | ORAL | 1 refills | Status: DC

## 2021-04-13 NOTE — Assessment & Plan Note (Signed)
Excellent control per A1C. NO symptoms of lows, but not checking. Rx for glucometer given to check blood sugar. Encouraged exercise, weight loss, healthy eating habits.

## 2021-04-13 NOTE — Patient Instructions (Signed)
Keep up the great work with healthy lifestyle!

## 2021-04-13 NOTE — Progress Notes (Signed)
Patient ID: Anthony Carroll, male    DOB: Nov 06, 1975, 45 y.o.   MRN: [id number]  This visit was conducted in person.   Vitals:   04/13/21 0933  BP: 138/86  Pulse: 70  Temp: 97.6 F (36.4 C)  SpO2: 99%     CC:  Chief Complaint  Patient presents with   Diabetes    Subjective:   HPI: Jye Fariss is a 45 y.o. male presenting on 04/13/2021 for No chief complaint on file.   Diabetes:  Gret control on metformin 500 mg BID  A1C today 6.2 Using medications without difficulties: none Hypoglycemic episodes:none Hyperglycemic episodes: none  No ulcers on feet. Blood Sugars averaging: eye exam within last year: eye  Has not not been checking lately.   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 Interim medical history since our last visit reviewed. Allergies and medications reviewed and updated. Outpatient Medications Prior to Visit  Medication Sig Dispense Refill   atorvastatin (LIPITOR) 20 MG tablet Take 1 tablet (20 mg total) by mouth every evening. 90 tablet 1   cetirizine (ZYRTEC) 10 MG tablet Take 10 mg by mouth daily.     desonide (DESOWEN) 0.05 % ointment Apply 1 application topically 2 (two) times daily. 15 g 5   dorzolamide-timolol (COSOPT) 22.3-6.8 MG/ML ophthalmic solution Place 1 drop into both eyes 2 (two) times daily.     metFORMIN (GLUCOPHAGE) 500 MG tablet Take 1 tablet (500 mg total) by mouth 2 (two) times daily with a meal. 180 tablet 0   Multiple Vitamins-Minerals (MULTIVITAMIN WITH MINERALS) tablet Take 1 tablet by mouth daily.     Olopatadine HCl (PAZEO) 0.7 % SOLN Place 1 drop into both eyes 1 day or 1 dose. 1 Bottle 5   triamcinolone cream (KENALOG) 0.1 %   3   No facility-administered medications prior to visit.     Per HPI  unless specifically indicated in ROS section below Review of Systems  Constitutional:  Negative for fatigue and fever.  HENT:  Negative for ear pain.   Eyes:  Negative for pain.  Respiratory:  Negative for cough and shortness of breath.   Cardiovascular:  Negative for chest pain, palpitations and leg swelling.  Gastrointestinal:  Negative for abdominal pain.  Genitourinary:  Negative for dysuria.  Musculoskeletal:  Negative for arthralgias.  Neurological:  Negative for syncope, light-headedness and headaches.  Psychiatric/Behavioral:  Negative for dysphoric mood.   Objective:  There were no vitals taken for this visit.  Wt Readings from Last 3 Encounters:  09/13/20 209 lb 8 oz (95 kg)  02/12/20 198 lb 8 oz (90 kg)  10/20/19 202 lb 7 oz (91.8 kg)      Physical Exam Constitutional:      Appearance: He is well-developed.  HENT:     Head: Normocephalic.     Right Ear: Hearing normal.     Left Ear: Hearing normal.     Nose: Nose normal.  Neck:     Thyroid: No thyroid mass or thyromegaly.     Vascular: No carotid bruit.     Trachea: Trachea normal.  Cardiovascular:     Rate and Rhythm: Normal rate and regular rhythm.     Pulses: Normal pulses.     Heart  sounds: Heart sounds not distant. No murmur heard.   No friction rub. No gallop.     Comments: No peripheral edema Pulmonary:     Effort: Pulmonary effort is normal. No respiratory distress.     Breath sounds: Normal breath sounds.  Skin:    General: Skin is warm and dry.     Findings: No rash.  Psychiatric:        Speech: Speech normal.        Behavior: Behavior normal.        Thought Content: Thought content normal.      Diabetic foot exam: Normal inspection No skin breakdown No calluses  Normal DP pulses Normal sensation to light touch and monofilament Nails normal  Results for orders placed or performed in visit on 12/13/20  HM DIABETES EYE EXAM  Result Value Ref Range   HM Diabetic Eye Exam No Retinopathy No  Retinopathy    This visit occurred during the SARS-CoV-2 public health emergency.  Safety protocols were in place, including screening questions prior to the visit, additional usage of staff PPE, and extensive cleaning of exam room while observing appropriate contact time as indicated for disinfecting solutions.   COVID 19 screen:  No recent travel or known exposure to COVID19 The patient denies respiratory symptoms of COVID 19 at this time. The importance of social distancing was discussed today.   Assessment and Plan    Problem List Items Addressed This Visit     Controlled type 2 diabetes mellitus without complication, without long-term current use of insulin (HCC) - Primary     Excellent control per A1C. NO symptoms of lows, but not checking. Rx for glucometer given to check blood sugar. Encouraged exercise, weight loss, healthy eating habits.       Relevant Medications   metFORMIN (GLUCOPHAGE) 500 MG tablet   Other Relevant Orders   POCT glycosylated hemoglobin (Hb A1C) (Completed)   Other Visit Diagnoses     Diabetes mellitus without complication (HCC)       Relevant Medications   metFORMIN (GLUCOPHAGE) 500 MG tablet        Kerby Nora, MD

## 2021-04-18 ENCOUNTER — Telehealth: Payer: Self-pay | Admitting: Family Medicine

## 2021-04-18 NOTE — Telephone Encounter (Signed)
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

## 2021-04-18 NOTE — Telephone Encounter (Signed)
Left message for Anthony Carroll to call us back or send a MyChart message giving [place] more information on what needs to be addressed in this letter she is requesting for Anthony Carroll.

## 2021-04-18 NOTE — Telephone Encounter (Signed)
Please gather more information on what letter needs.. can write skeleton and I will edit and sign. Thank you.

## 2021-04-20 NOTE — Telephone Encounter (Signed)
Left another message for Anthony Carroll to call us back or send a MyChart message giving [place] more information on what needs to be addressed in this supportive letter she is requesting for [place].  Dr. Ermalene Searing has also sent patient a MyChart message asking for additional information.  So far no return call or MyChart from patient or his wife.

## 2021-05-13 ENCOUNTER — Other Ambulatory Visit: Payer: Self-pay | Admitting: Family Medicine

## 2021-05-30 ENCOUNTER — Other Ambulatory Visit: Payer: Self-pay

## 2021-05-30 ENCOUNTER — Telehealth (INDEPENDENT_AMBULATORY_CARE_PROVIDER_SITE_OTHER): Payer: 59 | Admitting: Family Medicine

## 2021-05-30 ENCOUNTER — Encounter: Payer: Self-pay | Admitting: Family Medicine

## 2021-05-30 VITALS — Ht 69.0 in

## 2021-05-30 DIAGNOSIS — R051 Acute cough: Secondary | ICD-10-CM | POA: Diagnosis not present

## 2021-05-30 NOTE — Progress Notes (Signed)
VIRTUAL VISIT Due to national recommendations of social distancing due to COVID 19, a virtual visit is felt to be most appropriate for this patient at this time.   I connected with the patient on 05/30/21 at  3:20 PM EST by virtual telehealth platform and verified that I am speaking with the correct person using two identifiers.   I discussed the limitations, risks, security and privacy concerns of performing an evaluation and management service by  virtual telehealth platform and the availability of in person appointments. I also discussed with the patient that there may be a patient responsible charge related to this service. The patient expressed understanding and agreed to proceed.  Patient location: Home Provider Location: [facility] Participants: Kerby Nora and Algie Coffer   Chief Complaint  Patient presents with   Generalized Body Aches   Cough   Sore Throat   Headache   Back Pain   Nasal Congestion    Symptoms started last Thursday-No Covid test done    History of Present Illness: 45 year old male patient with history of diabetes, former smoker and overweight status presents with cough.   Date of onset: 05/25/2021, day 6   He has been travelling as a Naval architect, started with ST, progressed to  back ache, nasal congestion, cough, and headache.  No fever.  No SOB, NO wheeze.  Dry cough,  not keeping him up at night.   Using theraflu.. has helped with symptoms some.   He is feeling much better overall.   COVID 19 screen COVID testing: none COVID vaccine: 03/06/2020 , 02/14/2020, booster in 03/2021,no flu vaccine COVID exposure: No recent travel or known exposure to COVID19, flu  The importance of social distancing was discussed today.    Review of Systems  All other systems reviewed and are negative.    Past Medical History:  Diagnosis Date   Eczema    Glaucoma     reports that he has quit smoking. His smoking use included cigarettes. He has never  used smokeless tobacco. He reports current alcohol use. He reports that he does not use drugs.   Current Outpatient Medications:    ACCU-CHEK GUIDE test strip, CHECK BLOOD SUGAR DAILY, Disp: 100 strip, Rfl: 3   atorvastatin (LIPITOR) 20 MG tablet, Take 1 tablet (20 mg total) by mouth every evening., Disp: 90 tablet, Rfl: 1   cetirizine (ZYRTEC) 10 MG tablet, Take 10 mg by mouth daily., Disp: , Rfl:    metFORMIN (GLUCOPHAGE) 500 MG tablet, Take 1 tablet (500 mg total) by mouth 2 (two) times daily with a meal., Disp: 180 tablet, Rfl: 1   Multiple Vitamins-Minerals (MULTIVITAMIN WITH MINERALS) tablet, Take 1 tablet by mouth daily., Disp: , Rfl:    Observations/Objective: Height 5\' 9"  (1.753 m).  Physical Exam  Physical Exam Constitutional:      General: The patient is not in acute distress. Pulmonary:     Effort: Pulmonary effort is normal. No respiratory distress.  Neurological:     Mental Status: The patient is alert and oriented to person, place, and time.  Psychiatric:        Mood and Affect: Mood normal.        Behavior: Behavior normal.   Assessment and Plan    Problem List Items Addressed This Visit     Acute cough - Primary    Likely viral URI... no benefit of testing for flu and COVID day 6 of illness.  Pt improving.. continue symptomatic and supportive  care.       I discussed the assessment and treatment plan with the patient. The patient was provided an opportunity to ask questions and all were answered. The patient agreed with the plan and demonstrated an understanding of the instructions.   The patient was advised to call back or seek an in-person evaluation if the symptoms worsen or if the condition fails to improve as anticipated.     Kerby Nora, MD  x

## 2021-07-13 DIAGNOSIS — R051 Acute cough: Secondary | ICD-10-CM | POA: Insufficient documentation

## 2021-07-13 NOTE — Assessment & Plan Note (Signed)
Likely viral URI... no benefit of testing for flu and COVID day 6 of illness.  Pt improving.. continue symptomatic and supportive care.

## 2021-10-13 ENCOUNTER — Ambulatory Visit (INDEPENDENT_AMBULATORY_CARE_PROVIDER_SITE_OTHER): Payer: PRIVATE HEALTH INSURANCE | Admitting: Family Medicine

## 2021-10-13 ENCOUNTER — Encounter: Payer: Self-pay | Admitting: Family Medicine

## 2021-10-13 VITALS — BP 128/84 | HR 78 | Temp 98.2°F | Ht 68.75 in | Wt 220.5 lb

## 2021-10-13 DIAGNOSIS — E663 Overweight: Secondary | ICD-10-CM

## 2021-10-13 DIAGNOSIS — E1169 Type 2 diabetes mellitus with other specified complication: Secondary | ICD-10-CM

## 2021-10-13 DIAGNOSIS — E119 Type 2 diabetes mellitus without complications: Secondary | ICD-10-CM

## 2021-10-13 DIAGNOSIS — E6609 Other obesity due to excess calories: Secondary | ICD-10-CM | POA: Diagnosis not present

## 2021-10-13 DIAGNOSIS — Z1211 Encounter for screening for malignant neoplasm of colon: Secondary | ICD-10-CM

## 2021-10-13 DIAGNOSIS — Z0000 Encounter for general adult medical examination without abnormal findings: Secondary | ICD-10-CM

## 2021-10-13 DIAGNOSIS — Z6832 Body mass index (BMI) 32.0-32.9, adult: Secondary | ICD-10-CM

## 2021-10-13 DIAGNOSIS — Z125 Encounter for screening for malignant neoplasm of prostate: Secondary | ICD-10-CM

## 2021-10-13 DIAGNOSIS — Z6829 Body mass index (BMI) 29.0-29.9, adult: Secondary | ICD-10-CM

## 2021-10-13 DIAGNOSIS — E785 Hyperlipidemia, unspecified: Secondary | ICD-10-CM

## 2021-10-13 LAB — COMPREHENSIVE METABOLIC PANEL
ALT: 26 U/L (ref 0–53)
AST: 27 U/L (ref 0–37)
Albumin: 4.6 g/dL (ref 3.5–5.2)
Alkaline Phosphatase: 51 U/L (ref 39–117)
BUN: 12 mg/dL (ref 6–23)
CO2: 27 mEq/L (ref 19–32)
Calcium: 9 mg/dL (ref 8.4–10.5)
Chloride: 105 mEq/L (ref 96–112)
Creatinine, Ser: 0.96 mg/dL (ref 0.40–1.50)
GFR: 94.42 mL/min (ref >60.00)
Glucose, Bld: 84 mg/dL (ref 70–99)
Potassium: 4 mEq/L (ref 3.5–5.1)
Sodium: 139 mEq/L (ref 135–145)
Total Bilirubin: 0.8 mg/dL (ref 0.2–1.2)
Total Protein: 7.4 g/dL (ref 6.0–8.3)

## 2021-10-13 LAB — LIPID PANEL
Cholesterol: 147 mg/dL (ref 0–200)
HDL: 59.5 mg/dL (ref >39.00)
LDL Cholesterol: 77 mg/dL (ref 0–99)
NonHDL: 87.04
Total CHOL/HDL Ratio: 2
Triglycerides: 52 mg/dL (ref 0.0–149.0)
VLDL: 10.4 mg/dL (ref 0.0–40.0)

## 2021-10-13 LAB — POCT GLYCOSYLATED HEMOGLOBIN (HGB A1C): Hemoglobin A1C: 6.1 % — AB (ref 4.0–5.6)

## 2021-10-13 LAB — MICROALBUMIN / CREATININE URINE RATIO
Creatinine,U: 196.8 mg/dL
Microalb Creat Ratio: 6.5 mg/g (ref 0.0–30.0)
Microalb, Ur: 12.7 mg/dL — ABNORMAL HIGH (ref 0.0–1.9)

## 2021-10-13 LAB — PSA: PSA: 0.65 ng/mL (ref 0.10–4.00)

## 2021-10-13 LAB — HM DIABETES FOOT EXAM

## 2021-10-13 NOTE — Progress Notes (Signed)
Patient ID: Anthony Carroll, male    DOB: October 01, 1975, 46 y.o.   MRN: [id number]  This visit was conducted in person.  BP 128/84   Pulse 78   Temp 98.2 F (36.8 C) (Oral)   Ht 5' 8.75" (1.746 m)   Wt 220 lb 8 oz (100 kg)   SpO2 96%   BMI 32.80 kg/m    CC:  Chief Complaint  Patient presents with   Annual Exam    Subjective:   HPI: Anthony Carroll is a 46 y.o. male presenting on 10/13/2021 for Annual Exam  Diabetes:   Well controlled on metformin 500 mg BID  Lab Results  Component Value Date   HGBA1C 6.1 (A) 10/13/2021  Using medications without difficulties: yes Hypoglycemic episodes: none Hyperglycemic episodes: none Feet problems: none Blood Sugars averaging: FBS 80 eye exam within last year: scheduled   Elevated Cholesterol:  previously good control on atorvastatin 20 mg daily Due for re-eval.  Using medications without problems: Muscle aches:  Diet compliance: heart healthy diet Exercise: very active in [employer]. Other complaints:  Body mass index is 32.8 kg/m. Wt Readings from Last 3 Encounters:  10/13/21 220 lb 8 oz (100 kg)  04/13/21 213 lb (96.6 kg)  09/13/20 209 lb 8 oz (95 kg)       Relevant past medical, surgical, family and social history reviewed and updated as indicated. Interim medical history since our last visit reviewed. Allergies and medications reviewed and updated. Outpatient Medications Prior to Visit  Medication Sig Dispense Refill   ACCU-CHEK GUIDE test strip CHECK BLOOD SUGAR DAILY 100 strip 3   atorvastatin (LIPITOR) 20 MG tablet Take 1 tablet (20 mg total) by mouth every evening. 90 tablet 1   cetirizine (ZYRTEC) 10 MG tablet Take 10 mg by mouth daily.     metFORMIN (GLUCOPHAGE) 500 MG tablet Take 1 tablet (500 mg total) by mouth 2 (two) times daily with a meal. 180 tablet 1   Multiple Vitamins-Minerals (MULTIVITAMIN WITH MINERALS) tablet Take 1 tablet by mouth daily.     No facility-administered medications prior to visit.      Per HPI unless specifically indicated in ROS section below Review of Systems  Constitutional:  Negative for fatigue and fever.  HENT:  Negative for ear pain.   Eyes:  Negative for pain.  Respiratory:  Negative for cough and shortness of breath.   Cardiovascular:  Negative for chest pain, palpitations and leg swelling.  Gastrointestinal:  Negative for abdominal pain.  Genitourinary:  Negative for dysuria.  Musculoskeletal:  Negative for arthralgias.  Neurological:  Negative for syncope, light-headedness and headaches.  Psychiatric/Behavioral:  Negative for dysphoric mood.   Objective:  BP 128/84   Pulse 78   Temp 98.2 F (36.8 C) (Oral)   Ht 5' 8.75" (1.746 m)   Wt 220 lb 8 oz (100 kg)   SpO2 96%   BMI 32.80 kg/m   Wt Readings from Last 3 Encounters:  10/13/21 220 lb 8 oz (100 kg)  04/13/21 213 lb (96.6 kg)  09/13/20 209 lb 8 oz (95 kg)      Physical Exam Constitutional:      Appearance: He is well-developed.  HENT:     Head: Normocephalic.     Right Ear: Hearing normal.     Left Ear: Hearing normal.     Nose: Nose normal.  Neck:     Thyroid: No thyroid mass or thyromegaly.     Vascular: No carotid bruit.  Trachea: Trachea normal.  Cardiovascular:     Rate and Rhythm: Normal rate and regular rhythm.     Pulses: Normal pulses.     Heart sounds: Heart sounds not distant. No murmur heard.   No friction rub. No gallop.     Comments: No peripheral edema Pulmonary:     Effort: Pulmonary effort is normal. No respiratory distress.     Breath sounds: Normal breath sounds.  Skin:    General: Skin is warm and dry.     Findings: No rash.  Psychiatric:        Speech: Speech normal.        Behavior: Behavior normal.        Thought Content: Thought content normal.      Diabetic foot exam: Normal inspection No skin breakdown No calluses  Normal DP pulses Normal sensation to light touch and monofilament Nails normal  Results for orders placed or performed in  visit on 10/13/21  POCT glycosylated hemoglobin (Hb A1C)  Result Value Ref Range   Hemoglobin A1C 6.1 (A) 4.0 - 5.6 %   HbA1c POC (<> result, manual entry)     HbA1c, POC (prediabetic range)     HbA1c, POC (controlled diabetic range)      This visit occurred during the SARS-CoV-2 public health emergency.  Safety protocols were in place, including screening questions prior to the visit, additional usage of staff PPE, and extensive cleaning of exam room while observing appropriate contact time as indicated for disinfecting solutions.   COVID 19 screen:  No recent travel or known exposure to COVID19 The patient denies respiratory symptoms of COVID 19 at this time. The importance of social distancing was discussed today.   Assessment and Plan   The patient's preventative maintenance and recommended screening tests for an annual wellness exam were reviewed in full today. Brought up to date unless services declined.  Counselled on the importance of diet, exercise, and its role in overall health and mortality. The patient's FH and SH was reviewed, including their home life, tobacco status, and drug and alcohol status.    Vaccines:uptodate Prostate Cancer Screen: DUE Colon Cancer Screen:  DUE      Smoking Status:  former ETOH/ drug use:  occ/none  Hep C:  done  HIV screen:  refused  Problem List Items Addressed This Visit     Controlled type 2 diabetes mellitus without complication, without long-term current use of insulin (HCC)    Chronic, Well controlled  Continue metformin 500 mg p.o. twice daily       Relevant Orders   POCT glycosylated hemoglobin (Hb A1C) (Completed)   Lipid panel (Completed)   Comprehensive metabolic panel (Completed)   Microalbumin / creatinine urine ratio (Completed)   Hyperlipidemia associated with type 2 diabetes mellitus (HCC)    Chronic, previously good control on atorvastatin 20 mg daily Due for re-eval.      Overweight with body mass index  (BMI) of 29 to 29.9 in adult    Encouraged exercise, weight loss, healthy eating habits.        Other Visit Diagnoses     Routine general medical examination at a health care facility    -  Primary   Class 1 obesity due to excess calories with serious comorbidity and body mass index (BMI) of 32.0 to 32.9 in adult       Prostate cancer screening       Relevant Orders   PSA (Completed)   Colon cancer  screening       Relevant Orders   Ambulatory referral to Gastroenterology      Orders Placed This Encounter  Procedures   Lipid panel   Comprehensive metabolic panel   Microalbumin / creatinine urine ratio   PSA   Ambulatory referral to Gastroenterology    Referral Priority:   Routine    Referral Type:   Consultation    Referral Reason:   Specialty Services Required    Number of Visits Requested:   1   POCT glycosylated hemoglobin (Hb A1C)   HM DIABETES FOOT EXAM    This external order was created through the Results Console.     Kerby NoraAmy Thresea Doble, MD

## 2021-10-13 NOTE — Patient Instructions (Addendum)
Please stop at the lab to have labs drawn.  

## 2021-10-16 ENCOUNTER — Telehealth: Payer: Self-pay

## 2021-10-16 DIAGNOSIS — Z1211 Encounter for screening for malignant neoplasm of colon: Secondary | ICD-10-CM

## 2021-10-16 NOTE — Telephone Encounter (Signed)
CALLED PATIENT NO ANSWER LEFT VOICEMAIL FOR A CALL BACK °Letter sent °

## 2021-10-16 NOTE — Telephone Encounter (Signed)
Left message for patient to call office. ? ?Thanks, ?Marcelino Duster, CMA ?

## 2021-11-22 NOTE — Assessment & Plan Note (Signed)
Chronic, previously good control on atorvastatin 20 mg daily Due for re-eval.

## 2021-11-22 NOTE — Assessment & Plan Note (Signed)
Chronic, Well controlled  Continue metformin 500 mg p.o. twice daily

## 2021-11-22 NOTE — Assessment & Plan Note (Signed)
Encouraged exercise, weight loss, healthy eating habits. ? ?

## 2022-01-12 ENCOUNTER — Ambulatory Visit: Payer: PRIVATE HEALTH INSURANCE | Admitting: Family Medicine

## 2022-01-26 ENCOUNTER — Ambulatory Visit (INDEPENDENT_AMBULATORY_CARE_PROVIDER_SITE_OTHER): Payer: PRIVATE HEALTH INSURANCE | Admitting: Family Medicine

## 2022-01-26 VITALS — BP 120/80 | HR 85 | Temp 97.5°F | Ht 68.75 in | Wt 218.5 lb

## 2022-01-26 DIAGNOSIS — E785 Hyperlipidemia, unspecified: Secondary | ICD-10-CM | POA: Diagnosis not present

## 2022-01-26 DIAGNOSIS — R079 Chest pain, unspecified: Secondary | ICD-10-CM | POA: Diagnosis not present

## 2022-01-26 DIAGNOSIS — E119 Type 2 diabetes mellitus without complications: Secondary | ICD-10-CM | POA: Diagnosis not present

## 2022-01-26 DIAGNOSIS — E1169 Type 2 diabetes mellitus with other specified complication: Secondary | ICD-10-CM

## 2022-01-26 LAB — POCT GLYCOSYLATED HEMOGLOBIN (HGB A1C): Hemoglobin A1C: 6.2 % — AB (ref 4.0–5.6)

## 2022-01-26 MED ORDER — ATORVASTATIN CALCIUM 20 MG PO TABS: 20.0000 mg | ORAL_TABLET | Freq: Every evening | ORAL | 3 refills | Status: DC

## 2022-01-26 NOTE — Progress Notes (Signed)
Patient ID: Anthony Carroll, male    DOB: [birth year], 46 y.o.   MRN: [id number]  This visit was conducted in person.  BP 120/80   Pulse 85   Temp (!) 97.5 F (36.4 C) (Temporal)   Ht 5' 8.75" (1.746 m)   Wt 218 lb 8 oz (99.1 kg)   SpO2 97%   BMI 32.50 kg/m    CC:  Chief Complaint  Patient presents with   Follow-up    DM. Pt has been out of Atorvastatin for a couple months. Pended refill.    Subjective:   HPI: Anthony Carroll is a 46 y.o. male presenting on 01/26/2022 for Follow-up (DM./Pt has been out of Atorvastatin for a couple months. Pended refill.)    Diabetes: Well-controlled off metformin 500 Mg BID Lab Results  Component Value Date   HGBA1C 6.2 (A) 01/26/2022  Using medications without difficulties: Hypoglycemic episodes: none Hyperglycemic episodes: none Feet problems: no ulcers Blood Sugars averaging: FBS 106, after lunch 130 eye exam within last year:   Elevated Cholesterol: On atorvastatin 20 mg daily  Lab Results  Component Value Date   CHOL 147 10/13/2021   HDL 59.50 10/13/2021   LDLCALC 77 10/13/2021   TRIG 52.0 10/13/2021   CHOLHDL 2 10/13/2021  Using medications without problems: Muscle aches:  Diet compliance: heart healthy Exercise: 3-4 times a week Other complaints:   Occ pain in left lower chest after excessive, present for a while until he rests.  No change with food eating.  Occurring 2 times a week, increase a little in frequency but has been going on for 1 year.   Relevant past medical, surgical, family and social history reviewed and updated as indicated. Interim medical history since our last visit reviewed. Allergies and medications reviewed and updated. Outpatient Medications Prior to Visit  Medication Sig Dispense Refill   ACCU-CHEK GUIDE test strip CHECK BLOOD SUGAR DAILY 100 strip 3   atorvastatin (LIPITOR) 20 MG tablet Take 1 tablet (20 mg total) by mouth every evening. 90 tablet 1   cetirizine (ZYRTEC) 10 MG tablet Take 10 mg  by mouth daily.     metFORMIN (GLUCOPHAGE) 500 MG tablet Take 1 tablet (500 mg total) by mouth 2 (two) times daily with a meal. 180 tablet 1   Multiple Vitamins-Minerals (MULTIVITAMIN WITH MINERALS) tablet Take 1 tablet by mouth daily.     No facility-administered medications prior to visit.     Per HPI unless specifically indicated in ROS section below Review of Systems  Constitutional:  Negative for fatigue and fever.  HENT:  Negative for ear pain.   Eyes:  Negative for pain.  Respiratory:  Negative for cough and shortness of breath.   Cardiovascular:  Negative for chest pain, palpitations and leg swelling.  Gastrointestinal:  Negative for abdominal pain.  Genitourinary:  Negative for dysuria.  Musculoskeletal:  Negative for arthralgias.  Neurological:  Negative for syncope, light-headedness and headaches.  Psychiatric/Behavioral:  Negative for dysphoric mood.    Objective:  BP 120/80   Pulse 85   Temp (!) 97.5 F (36.4 C) (Temporal)   Ht 5' 8.75" (1.746 m)   Wt 218 lb 8 oz (99.1 kg)   SpO2 97%   BMI 32.50 kg/m   Wt Readings from Last 3 Encounters:  01/26/22 218 lb 8 oz (99.1 kg)  10/13/21 220 lb 8 oz (100 kg)  04/13/21 213 lb (96.6 kg)      Physical Exam Constitutional:      Appearance:  He is well-developed.  HENT:     Head: Normocephalic.     Right Ear: Hearing normal.     Left Ear: Hearing normal.     Nose: Nose normal.  Neck:     Thyroid: No thyroid mass or thyromegaly.     Vascular: No carotid bruit.     Trachea: Trachea normal.  Cardiovascular:     Rate and Rhythm: Normal rate and regular rhythm.     Pulses: Normal pulses.     Heart sounds: Heart sounds not distant. No murmur heard.    No friction rub. No gallop.     Comments: No peripheral edema Pulmonary:     Effort: Pulmonary effort is normal. No respiratory distress.     Breath sounds: Normal breath sounds.  Skin:    General: Skin is warm and dry.     Findings: No rash.  Psychiatric:         Speech: Speech normal.        Behavior: Behavior normal.        Thought Content: Thought content normal.       Results for orders placed or performed in visit on 01/26/22  POCT glycosylated hemoglobin (Hb A1C)  Result Value Ref Range   Hemoglobin A1C 6.2 (A) 4.0 - 5.6 %   HbA1c POC (<> result, manual entry)     HbA1c, POC (prediabetic range)     HbA1c, POC (controlled diabetic range)       COVID 19 screen:  No recent travel or known exposure to COVID19 The patient denies respiratory symptoms of COVID 19 at this time. The importance of social distancing was discussed today.   Assessment and Plan    Problem List Items Addressed This Visit     Chest pain - Primary    Occasional exertional chest pain, no clear musculoskeletal or reflux etiology.  Patient does lift weights and notes it is more with excessive weight lifting, so pain could still be musculoskeletal.  EKG today was unremarkable no change from previous with normal sinus rhythm, no LVH.  I recommended risk factor management and to consider further cardiac evaluation with stress testing if symptoms continue.  Patient was agreeable. ER precautions given.      Relevant Orders   EKG 12-Lead (Completed)   Controlled type 2 diabetes mellitus without complication, without long-term current use of insulin (HCC)    Excellent control of A1c despite being off metformin entirely.  He will continue working on lifestyle changes.  We will reevaluate in 3 months.      Relevant Medications   atorvastatin (LIPITOR) 20 MG tablet   Hyperlipidemia associated with type 2 diabetes mellitus (HCC)    Chronic, well controlled on atorvastatin 20 mg p.o. daily      Relevant Medications   atorvastatin (LIPITOR) 20 MG tablet   Other Visit Diagnoses     Diabetes mellitus without complication (HCC)       Relevant Medications   atorvastatin (LIPITOR) 20 MG tablet   Other Relevant Orders   POCT glycosylated hemoglobin (Hb A1C) (Completed)       Orders Placed This Encounter  Procedures   POCT glycosylated hemoglobin (Hb A1C)   EKG 12-Lead   Meds ordered this encounter  Medications   atorvastatin (LIPITOR) 20 MG tablet    Sig: Take 1 tablet (20 mg total) by mouth every evening.    Dispense:  90 tablet    Refill:  3     Kerby Nora, MD

## 2022-01-26 NOTE — Assessment & Plan Note (Signed)
Excellent control of A1c despite being off metformin entirely.  He will continue working on lifestyle changes.  We will reevaluate in 3 months.

## 2022-01-26 NOTE — Patient Instructions (Addendum)
Keep up the great work on healthy eating and regular exercise.  Stay of metformin.  If chest pain is persistent, increasing in frequency or severity.. call for cardiology referral for stress testing.

## 2022-01-26 NOTE — Assessment & Plan Note (Signed)
Occasional exertional chest pain, no clear musculoskeletal or reflux etiology.  Patient does lift weights and notes it is more with excessive weight lifting, so pain could still be musculoskeletal.  EKG today was unremarkable no change from previous with normal sinus rhythm, no LVH.  I recommended risk factor management and to consider further cardiac evaluation with stress testing if symptoms continue.  Patient was agreeable. ER precautions given.

## 2022-01-26 NOTE — Assessment & Plan Note (Signed)
Chronic, well controlled on atorvastatin 20 mg p.o. daily

## 2022-04-23 ENCOUNTER — Telehealth: Payer: Self-pay | Admitting: Family Medicine

## 2022-04-23 DIAGNOSIS — E119 Type 2 diabetes mellitus without complications: Secondary | ICD-10-CM

## 2022-04-23 DIAGNOSIS — Z125 Encounter for screening for malignant neoplasm of prostate: Secondary | ICD-10-CM

## 2022-04-23 NOTE — Telephone Encounter (Signed)
-----   Message from Ellamae Sia sent at 04/12/2022  3:50 PM EDT ----- Regarding: .Lab orders forTuesday, 11.7.23 Lab orders for a 3 month follow up appt.

## 2022-04-24 ENCOUNTER — Other Ambulatory Visit: Payer: PRIVATE HEALTH INSURANCE

## 2022-05-01 ENCOUNTER — Ambulatory Visit: Payer: PRIVATE HEALTH INSURANCE | Admitting: Family Medicine

## 2022-06-19 ENCOUNTER — Other Ambulatory Visit: Payer: Self-pay

## 2022-06-19 ENCOUNTER — Emergency Department: Payer: PRIVATE HEALTH INSURANCE

## 2022-06-19 ENCOUNTER — Telehealth: Payer: Self-pay

## 2022-06-19 DIAGNOSIS — I10 Essential (primary) hypertension: Secondary | ICD-10-CM | POA: Insufficient documentation

## 2022-06-19 DIAGNOSIS — R1013 Epigastric pain: Secondary | ICD-10-CM | POA: Diagnosis present

## 2022-06-19 LAB — CBC
HCT: 50.9 % (ref 39.0–52.0)
Hemoglobin: 16.5 g/dL (ref 13.0–17.0)
MCH: 28.6 pg (ref 26.0–34.0)
MCHC: 32.4 g/dL (ref 30.0–36.0)
MCV: 88.2 fL (ref 80.0–100.0)
Platelets: 187 10*3/uL (ref 150–400)
RBC: 5.77 MIL/uL (ref 4.22–5.81)
RDW: 15.2 % (ref 11.5–15.5)
WBC: 5.8 10*3/uL (ref 4.0–10.5)
nRBC: 0 % (ref 0.0–0.2)

## 2022-06-19 LAB — BASIC METABOLIC PANEL
Anion gap: 11 (ref 5–15)
BUN: 12 mg/dL (ref 6–20)
CO2: 25 mmol/L (ref 22–32)
Calcium: 9.4 mg/dL (ref 8.9–10.3)
Chloride: 98 mmol/L (ref 98–111)
Creatinine, Ser: 0.94 mg/dL (ref 0.61–1.24)
GFR, Estimated: >60 mL/min (ref >60)
Glucose, Bld: 99 mg/dL (ref 70–99)
Potassium: 4.1 mmol/L (ref 3.5–5.1)
Sodium: 134 mmol/L — ABNORMAL LOW (ref 135–145)

## 2022-06-19 LAB — TROPONIN I (HIGH SENSITIVITY)
Troponin I (High Sensitivity): 7 ng/L (ref <18)
Troponin I (High Sensitivity): 6 ng/L (ref <18)

## 2022-06-19 NOTE — Telephone Encounter (Signed)
[facility] - Client TELEPHONE ADVICE RECORD [facility] Patient Name: Anthony Carroll Gender: Male DOB: 1976/03/17 Age: 47 Y 38 M 29 D Return Phone Number: [phone] (Primary) Address: City/ State/ Zip: [place]  [address] Client [facility] - Client Client Site [place] - Day Provider Eliezer Lofts - MD Contact Type Call Who Is Calling Patient / Member / Family / Caregiver Call Type Triage / Clinical Relationship To Patient Self Return Phone Number [phone] (Primary) Chief Complaint Stool - Unusual Color Reason for Call Symptomatic / Request for Health Information Initial Comment Caller states he black stool. Translation No Nurse Assessment Nurse: Laqueta Due, RN, [employer]all[employer] (Eastern Time): 06/19/2022 4:44:08 PM Confirm and document reason for call. If symptomatic, describe symptoms. ---Pt is having black colored stool and slight intermittent abd pain. no fever. onset of black stool last night. Does the patient have any new or worsening symptoms? ---Yes Will a triage be completed? ---Yes Related visit to physician within the last 2 weeks? ---No Does the PT have any chronic conditions? (i.e. diabetes, asthma, this includes High risk factors for pregnancy, etc.) ---Yes List chronic conditions. ---diabetes Is this a behavioral health or substance abuse call? ---No Guidelines Guideline Title Affirmed Question Affirmed Notes Nurse Date/Time (Eastern Time) Rectal Bleeding Black or tarry bowel movements (Exception: Chronicunchanged black-grey BMs AND is taking iron pills or PeptoBismol.) Laqueta Due, RN, [employer] 06/19/2022 4:43:36 PM Disp. Time Eilene Ghazi Time) Disposition Final User 06/19/2022 4:47:19 PM Go to ED Now Yes Laqueta Due, RN, Amber PLEASE NOTE: All timestamps contained within this report are represented as [place] Standard Time. CONFIDENTIALTY NOTICE: This fax transmission is  intended only for the addressee. It contains information that is legally privileged, confidential or otherwise protected from use or disclosure. If you are not the intended recipient, you are strictly prohibited from reviewing, disclosing, copying using or disseminating any of this information or taking any action in reliance on or regarding this information. If you have received this fax in error, please notify us immediately by telephone so that we can arrange for its return to [place]. Phone: [phone], Toll-Free: [phone], Fax: [phone] Page: 2 of 2 Call Id: [id number] Final Disposition 06/19/2022 4:47:19 PM Go to ED Now Yes Laqueta Due, RN, Amber Caller Disagree/Comply Comply Caller Understands Yes PreDisposition Did not know what to do Care Advice Given Per Guideline GO TO ED NOW: * You need to be seen in the Emergency Department. CARE ADVICE given per Rectal Bleeding (Adult) guideline. Referrals [facility]

## 2022-06-19 NOTE — Telephone Encounter (Signed)
Per access nurse note pt agreed to go to ED. Pt already had an appt to see Gentry Fitz NP on 06/20/22 but per access note pt is going to ED. Sending note to Dr Diona Browner and Diona Browner pool and Gentry Fitz NP.

## 2022-06-19 NOTE — ED Triage Notes (Signed)
Patient comes in today via pov from home. Pt states that his primary care doctor called him today and told him to come to the ER, after he informed them of the black stools, chest, and abdominal pain he has been experiencing. Pt states that he had the episode of black stool yesterday, none today. Pt states that his chest pain and stomach pain are off and on. Pt states that he took some advil for pain last night after having the black stool.   Patient is alert and oriented x4, and NAD

## 2022-06-19 NOTE — Telephone Encounter (Signed)
Noted  

## 2022-06-20 ENCOUNTER — Ambulatory Visit: Payer: PRIVATE HEALTH INSURANCE | Admitting: Primary Care

## 2022-06-20 ENCOUNTER — Emergency Department
Admission: EM | Admit: 2022-06-20 | Discharge: 2022-06-20 | Disposition: A | Discharge status: Home / Self Care | Payer: PRIVATE HEALTH INSURANCE | Attending: Emergency Medicine | Admitting: Emergency Medicine

## 2022-06-20 DIAGNOSIS — R1013 Epigastric pain: Secondary | ICD-10-CM

## 2022-06-20 DIAGNOSIS — I10 Essential (primary) hypertension: Secondary | ICD-10-CM

## 2022-06-20 LAB — HEPATIC FUNCTION PANEL
ALT: 33 U/L (ref 0–44)
AST: 44 U/L — ABNORMAL HIGH (ref 15–41)
Albumin: 4.6 g/dL (ref 3.5–5.0)
Alkaline Phosphatase: 51 U/L (ref 38–126)
Bilirubin, Direct: 0.2 mg/dL (ref 0.0–0.2)
Indirect Bilirubin: 0.8 mg/dL (ref 0.3–0.9)
Total Bilirubin: 1 mg/dL (ref 0.3–1.2)
Total Protein: 8.4 g/dL — ABNORMAL HIGH (ref 6.5–8.1)

## 2022-06-20 LAB — LIPASE, BLOOD: Lipase: 33 U/L (ref 11–51)

## 2022-06-20 MED ORDER — PANTOPRAZOLE SODIUM 40 MG PO TBEC: 40.0000 mg | DELAYED_RELEASE_TABLET | Freq: Every day | ORAL | 3 refills | Status: DC

## 2022-06-20 NOTE — Telephone Encounter (Signed)
Unable to reach patient. Left voicemail to return call to our office.   

## 2022-06-20 NOTE — Discharge Instructions (Signed)
I sent a referral to a gastroenterologist specialist who should call you to make an appointment.  Take pantoprazole as prescribed.  Stop taking any NSAIDs this includes Motrin, Advil, naproxen, Aleve, ibuprofen because these medications may worsen your stomach pain.  Your blood pressure was high in the emergency department, please follow-up with your primary doctor to recheck blood pressure and start on medications as needed.

## 2022-06-20 NOTE — Telephone Encounter (Signed)
Unable to reach patient. Unable to leave voicemail.  

## 2022-06-20 NOTE — Telephone Encounter (Signed)
Looks like patient was evaluated in the ED. Okay to cancel the 11:40 am slot as long as he doesn't intend on coming. Also please fix the schedule, looks like someone added him in as 15 min.

## 2022-06-20 NOTE — ED Notes (Signed)
Pt Dc to home. Ambulatory out of dept with steady gait with family member

## 2022-06-20 NOTE — ED Provider Notes (Signed)
[facility] Provider Note    Event Date/Time   First MD Initiated Contact with Patient 06/20/22 [id number]     (approximate)   History   Chest Pain and Abdominal Pain   HPI  Anthony Carroll is a 47 y.o. male   Past medical history type 2 diabetes who presents with Several days of epigastric pain radiating to chest along with dark stools.  No history of GI bleeding.  No significant alcohol use.  No respiratory infectious symptoms, no exertional chest pain.  No blood thinner use. History obtained by patient and his wife at bedside.      Physical Exam   Triage Vital Signs: ED Triage Vitals  Enc Vitals Group     BP 06/19/22 1800 (!) 162/114     Pulse Rate 06/19/22 1800 85     Resp 06/19/22 1800 17     Temp 06/19/22 1800 98.6 F (37 C)     Temp Source 06/19/22 1800 Oral     SpO2 06/19/22 1800 98 %     Weight 06/19/22 1801 220 lb (99.8 kg)     Height 06/19/22 1801 5\' 9"  (1.753 m)     Head Circumference --      Peak Flow --      Pain Score 06/19/22 1801 4     Pain Loc --      Pain Edu? --      Excl. in [place]? --     Most recent vital signs: Vitals:   06/20/22 0220 06/20/22 0532  BP: (!) 165/113 (!) 145/102  Pulse: 72 74  Resp: 18 20  Temp: 98.2 F (36.8 C)   SpO2: 97% 98%    General: Awake, no distress.  CV:  Good peripheral perfusion.  Resp:  Normal effort.  Abd:  No distention.  Other:  Mild epigastric tenderness to palpation.  Lungs clear.  Abdomen otherwise soft, no rigidity or guarding.  He appears alert awake comfortable and pleasant.  Hemodynamics appropriate and reassuring, hypertension 140s over 100.   ED Results / Procedures / Treatments   Labs (all labs ordered are listed, but only abnormal results are displayed) Labs Reviewed  BASIC METABOLIC PANEL - Abnormal; Notable for the following components:      Result Value   Sodium 134 (*)    All other components within normal limits  HEPATIC FUNCTION PANEL - Abnormal; Notable for the  following components:   Total Protein 8.4 (*)    AST 44 (*)    All other components within normal limits  CBC  LIPASE, BLOOD  TROPONIN I (HIGH SENSITIVITY)  TROPONIN I (HIGH SENSITIVITY)     I reviewed labs and they are notable for normal hemoglobin.  EKG  ED ECG REPORT I, Lucillie Garfinkel, the attending physician, personally viewed and interpreted this ECG.   Date: 06/20/2022  EKG Time: 1758  Rate: 80  Rhythm: normal sinus rhythm  Axis: nl  Intervals:none  ST&T Change: No acute ischemic changes    RADIOLOGY I independently reviewed and interpreted chest x-ray and see no pneumothorax focality or free air   PROCEDURES:  Critical Care performed: No  Procedures   MEDICATIONS ORDERED IN ED: Medications - No data to display   IMPRESSION / MDM / [place] / ED COURSE  I reviewed the triage vital signs and the nursing notes.  Differential diagnosis includes, but is not limited to, gastritis, gastric ulcer, duodenal ulcer, GERD, ACS, intra-abdominal infection or obstruction, biliary pathology    MDM: This is a patient with epigastric pain radiating up through the chest with epigastric tenderness most consistent with GERD or gastritis.  Fortunately his rectal exam showed no frank blood or melena, hemoglobin is normal and his hemodynamics are appropriate reassuring.  I started him on Protonix and gave referral to GI.  His GPS score is 0 low risk for discharge upper GI bleeding.  I doubt ACS given atypical chest pain most associated with epigastric pain and nonischemic EKG and troponin negative.   Patient's presentation is most consistent with acute presentation with potential threat to life or bodily function.       FINAL CLINICAL IMPRESSION(S) / ED DIAGNOSES   Final diagnoses:  Epigastric pain  Hypertension, essential     Rx / DC Orders   ED Discharge Orders          Ordered    Ambulatory referral to Gastroenterology         06/20/22 0526    pantoprazole (PROTONIX) 40 MG tablet  Daily        06/20/22 0526             Note:  This document was prepared using Dragon voice recognition software and may include unintentional dictation errors.    Lucillie Garfinkel, MD 06/20/22 [phone]

## 2022-06-20 NOTE — Telephone Encounter (Signed)
[place] [facility] - Client Nonclinical Telephone Record  [facility] Client [place] Night - Client Client Site [place] - Night Provider AA - PHYSICIAN, Verita Schneiders- MD Contact Type Call Who Is Calling Patient / Member / Family / Caregiver Caller Name [place] Phone Number [phone] Call Type Message Only Information Provided Reason for Call Returning a Call from the Office Initial [place] states he is returning a missed call Additional Comment Provided office hours Disp. Time Disposition Final User 06/20/2022 7:22:29 AM General Information Provided Yes Vinnie Level Call Closed By: Vinnie Level Transaction Date/Time: 06/20/2022 7:20:56 AM (ET

## 2022-06-21 ENCOUNTER — Telehealth: Payer: Self-pay

## 2022-06-21 NOTE — Telephone Encounter (Signed)
Called patient and scheduled a hospital follow up with Dr. Diona Browner 06/27/22 @ 11:20

## 2022-06-21 NOTE — Telephone Encounter (Signed)
Transition Care Management Unsuccessful Follow-up Telephone Call  Date of discharge and from where:  06/20/2022 [facility] ED   Attempts:  1st Attempt  Reason for unsuccessful TCM follow-up call:  Left voice message

## 2022-06-22 ENCOUNTER — Telehealth: Payer: Self-pay

## 2022-06-22 NOTE — Telephone Encounter (Signed)
Transition Care Management Unsuccessful Follow-up Telephone Call  Date of discharge and from where:  06/20/2022 [facility] Ed  Attempts:  2nd Attempt  Reason for unsuccessful TCM follow-up call:  Left voice message

## 2022-06-27 ENCOUNTER — Inpatient Hospital Stay: Payer: 59 | Admitting: Family Medicine

## 2023-04-18 ENCOUNTER — Telehealth: Payer: Self-pay | Admitting: *Deleted

## 2023-04-18 DIAGNOSIS — E1169 Type 2 diabetes mellitus with other specified complication: Secondary | ICD-10-CM

## 2023-04-18 DIAGNOSIS — Z125 Encounter for screening for malignant neoplasm of prostate: Secondary | ICD-10-CM

## 2023-04-18 DIAGNOSIS — E119 Type 2 diabetes mellitus without complications: Secondary | ICD-10-CM

## 2023-04-18 NOTE — Telephone Encounter (Signed)
-----   Message from Lovena Neighbours sent at 04/18/2023  1:37 PM EDT ----- Regarding: Labs for Tuesday 04/30/23 Please put physical lab orders in future. Thank you, Denny Peon

## 2023-04-30 ENCOUNTER — Other Ambulatory Visit (INDEPENDENT_AMBULATORY_CARE_PROVIDER_SITE_OTHER): Payer: 59

## 2023-04-30 DIAGNOSIS — E119 Type 2 diabetes mellitus without complications: Secondary | ICD-10-CM

## 2023-04-30 DIAGNOSIS — E785 Hyperlipidemia, unspecified: Secondary | ICD-10-CM

## 2023-04-30 DIAGNOSIS — Z125 Encounter for screening for malignant neoplasm of prostate: Secondary | ICD-10-CM

## 2023-04-30 DIAGNOSIS — E1169 Type 2 diabetes mellitus with other specified complication: Secondary | ICD-10-CM | POA: Diagnosis not present

## 2023-04-30 LAB — COMPREHENSIVE METABOLIC PANEL
ALT: 40 U/L (ref 0–53)
AST: 39 U/L — ABNORMAL HIGH (ref 0–37)
Albumin: 4.8 g/dL (ref 3.5–5.2)
Alkaline Phosphatase: 65 U/L (ref 39–117)
BUN: 12 mg/dL (ref 6–23)
CO2: 26 meq/L (ref 19–32)
Calcium: 9.8 mg/dL (ref 8.4–10.5)
Chloride: 101 meq/L (ref 96–112)
Creatinine, Ser: 1.04 mg/dL (ref 0.40–1.50)
GFR: 84.85 mL/min (ref >60.00)
Glucose, Bld: 106 mg/dL — ABNORMAL HIGH (ref 70–99)
Potassium: 4 meq/L (ref 3.5–5.1)
Sodium: 138 meq/L (ref 135–145)
Total Bilirubin: 0.8 mg/dL (ref 0.2–1.2)
Total Protein: 8 g/dL (ref 6.0–8.3)

## 2023-04-30 LAB — MICROALBUMIN / CREATININE URINE RATIO
Creatinine,U: 381.6 mg/dL
Microalb Creat Ratio: 17.5 mg/g (ref 0.0–30.0)
Microalb, Ur: 66.8 mg/dL — ABNORMAL HIGH (ref 0.0–1.9)

## 2023-04-30 LAB — LIPID PANEL
Cholesterol: 305 mg/dL — ABNORMAL HIGH (ref 0–200)
HDL: 92.1 mg/dL (ref >39.00)
LDL Cholesterol: 194 mg/dL — ABNORMAL HIGH (ref 0–99)
NonHDL: 213.26
Total CHOL/HDL Ratio: 3
Triglycerides: 96 mg/dL (ref 0.0–149.0)
VLDL: 19.2 mg/dL (ref 0.0–40.0)

## 2023-04-30 LAB — PSA: PSA: 1.3 ng/mL (ref 0.10–4.00)

## 2023-04-30 LAB — HEMOGLOBIN A1C: Hgb A1c MFr Bld: 6.6 % — ABNORMAL HIGH (ref 4.6–6.5)

## 2023-04-30 NOTE — Progress Notes (Signed)
No critical labs need to be addressed urgently. We will discuss labs in detail at upcoming office visit.   

## 2023-05-01 ENCOUNTER — Telehealth: Payer: Self-pay

## 2023-05-01 NOTE — Telephone Encounter (Signed)
Sending to lsc support. 

## 2023-05-02 ENCOUNTER — Other Ambulatory Visit: Payer: Self-pay

## 2023-05-02 ENCOUNTER — Encounter: Payer: Self-pay | Admitting: Family Medicine

## 2023-05-02 ENCOUNTER — Telehealth: Payer: Self-pay

## 2023-05-02 ENCOUNTER — Ambulatory Visit: Payer: 59 | Admitting: Family Medicine

## 2023-05-02 VITALS — BP 136/88 | HR 91 | Temp 98.6°F | Ht 69.0 in | Wt 215.0 lb

## 2023-05-02 DIAGNOSIS — Z1211 Encounter for screening for malignant neoplasm of colon: Secondary | ICD-10-CM

## 2023-05-02 DIAGNOSIS — E1169 Type 2 diabetes mellitus with other specified complication: Secondary | ICD-10-CM

## 2023-05-02 DIAGNOSIS — E119 Type 2 diabetes mellitus without complications: Secondary | ICD-10-CM

## 2023-05-02 DIAGNOSIS — E663 Overweight: Secondary | ICD-10-CM | POA: Diagnosis not present

## 2023-05-02 DIAGNOSIS — Z6829 Body mass index (BMI) 29.0-29.9, adult: Secondary | ICD-10-CM

## 2023-05-02 DIAGNOSIS — Z0000 Encounter for general adult medical examination without abnormal findings: Secondary | ICD-10-CM | POA: Diagnosis not present

## 2023-05-02 DIAGNOSIS — E785 Hyperlipidemia, unspecified: Secondary | ICD-10-CM

## 2023-05-02 LAB — HM DIABETES FOOT EXAM

## 2023-05-02 MED ORDER — NA SULFATE-K SULFATE-MG SULF 17.5-3.13-1.6 GM/177ML PO SOLN: 1.0000 | Freq: Once | ORAL | 0 refills | Status: AC

## 2023-05-02 MED ORDER — ATORVASTATIN CALCIUM 20 MG PO TABS: 20.0000 mg | ORAL_TABLET | Freq: Every evening | ORAL | 3 refills | Status: AC

## 2023-05-02 NOTE — Assessment & Plan Note (Signed)
Encouraged exercise, weight loss, healthy eating habits. ? ?

## 2023-05-02 NOTE — Telephone Encounter (Signed)
Gastroenterology Pre-Procedure Review  Request Date: 06/28/23 Requesting Physician: Dr. Allegra Lai  PATIENT REVIEW QUESTIONS: The patient responded to the following health history questions as indicated:    1. Are you having any GI issues? no 2. Do you have a personal history of Polyps? no 3. Do you have a family history of Colon Cancer or Polyps? no 4. Diabetes Mellitus? yes (controlled by diet just advised by pcp to stop metformin since his numbers were looking good) 5. Joint replacements in the past 12 months?no 6. Major health problems in the past 3 months?no 7. Any artificial heart valves, MVP, or defibrillator?no    MEDICATIONS & ALLERGIES:    Patient reports the following regarding taking any anticoagulation/antiplatelet therapy:   Plavix, Coumadin, Eliquis, Xarelto, Lovenox, Pradaxa, Brilinta, or Effient? no Aspirin? no  Patient confirms/reports the following medications:  Current Outpatient Medications  Medication Sig Dispense Refill   ACCU-CHEK GUIDE test strip CHECK BLOOD SUGAR DAILY 100 strip 3   atorvastatin (LIPITOR) 20 MG tablet Take 1 tablet (20 mg total) by mouth every evening. 90 tablet 3   cetirizine (ZYRTEC) 10 MG tablet Take 10 mg by mouth daily.     Multiple Vitamins-Minerals (MULTIVITAMIN WITH MINERALS) tablet Take 1 tablet by mouth daily.     pantoprazole (PROTONIX) 40 MG tablet Take 1 tablet (40 mg total) by mouth daily. 90 tablet 3   No current facility-administered medications for this visit.    Patient confirms/reports the following allergies:  No Known Allergies  No orders of the defined types were placed in this encounter.   AUTHORIZATION INFORMATION Primary Insurance: 1D#: Group #:  Secondary Insurance: 1D#: Group #:  SCHEDULE INFORMATION: Date: 06/28/23 Time: Location: [facility]

## 2023-05-02 NOTE — Assessment & Plan Note (Signed)
Chronic, poor control LDL not at goal less than 308. Restart atorvastatin 20 mg daily

## 2023-05-02 NOTE — Progress Notes (Signed)
Patient ID: Anthony Carroll, male    DOB: August 12, 1975, 47 y.o.   MRN: [id number]  This visit was conducted in person.  BP 136/88 (BP Location: Left Arm, Patient Position: Sitting, Cuff Size: Normal)   Pulse 91   Temp 98.6 F (37 C) (Oral)   Ht 5\' 9"  (1.753 m)   Wt 215 lb (97.5 kg)   SpO2 97%   BMI 31.75 kg/m    CC:  Chief Complaint  Patient presents with   Annual Exam    Subjective:   HPI: Anthony Carroll is a 47 y.o. male presenting on 05/02/2023 for Annual Exam  Diabetes:   Well controlled with diet.   No longer on metformin 500 mg BID  Lab Results  Component Value Date   HGBA1C 6.6 (H) 04/30/2023  Using medications without difficulties: yes Hypoglycemic episodes: none Hyperglycemic episodes: none Feet problems: none Blood Sugars averaging: FBS 80 eye exam within last year: scheduled   Elevated Cholesterol:  previously good control on atorvastatin 20 mg daily  Now poor control off statin. Lab Results  Component Value Date   CHOL 305 (H) 04/30/2023   HDL 92.10 04/30/2023   LDLCALC 194 (H) 04/30/2023   TRIG 96.0 04/30/2023   CHOLHDL 3 04/30/2023  Using medications without problems: Muscle aches:  Diet compliance: heart healthy diet, some slip ups Exercise: very active in [employer]. Other complaints:  Body mass index is 31.75 kg/m. Wt Readings from Last 3 Encounters:  05/02/23 215 lb (97.5 kg)  06/19/22 220 lb (99.8 kg)  01/26/22 218 lb 8 oz (99.1 kg)       Relevant past medical, surgical, family and social history reviewed and updated as indicated. Interim medical history since our last visit reviewed. Allergies and medications reviewed and updated. Outpatient Medications Prior to Visit  Medication Sig Dispense Refill   ACCU-CHEK GUIDE test strip CHECK BLOOD SUGAR DAILY 100 strip 3   cetirizine (ZYRTEC) 10 MG tablet Take 10 mg by mouth daily.     Multiple Vitamins-Minerals (MULTIVITAMIN WITH MINERALS) tablet Take 1 tablet by mouth daily.     pantoprazole  (PROTONIX) 40 MG tablet Take 1 tablet (40 mg total) by mouth daily. 90 tablet 3   atorvastatin (LIPITOR) 20 MG tablet Take 1 tablet (20 mg total) by mouth every evening. 90 tablet 3   No facility-administered medications prior to visit.     Per HPI unless specifically indicated in ROS section below Review of Systems  Constitutional:  Negative for fatigue and fever.  HENT:  Negative for ear pain.   Eyes:  Negative for pain.  Respiratory:  Negative for cough and shortness of breath.   Cardiovascular:  Negative for chest pain, palpitations and leg swelling.  Gastrointestinal:  Negative for abdominal pain.  Genitourinary:  Negative for dysuria.  Musculoskeletal:  Negative for arthralgias.  Neurological:  Negative for syncope, light-headedness and headaches.  Psychiatric/Behavioral:  Negative for dysphoric mood.    Objective:  BP 136/88 (BP Location: Left Arm, Patient Position: Sitting, Cuff Size: Normal)   Pulse 91   Temp 98.6 F (37 C) (Oral)   Ht 5\' 9"  (1.753 m)   Wt 215 lb (97.5 kg)   SpO2 97%   BMI 31.75 kg/m   Wt Readings from Last 3 Encounters:  05/02/23 215 lb (97.5 kg)  06/19/22 220 lb (99.8 kg)  01/26/22 218 lb 8 oz (99.1 kg)      Physical Exam Constitutional:      Appearance: He is well-developed.  HENT:     Head: Normocephalic.     Right Ear: Hearing normal.     Left Ear: Hearing normal.     Nose: Nose normal.  Neck:     Thyroid: No thyroid mass or thyromegaly.     Vascular: No carotid bruit.     Trachea: Trachea normal.  Cardiovascular:     Rate and Rhythm: Normal rate and regular rhythm.     Pulses: Normal pulses.     Heart sounds: Heart sounds not distant. No murmur heard.    No friction rub. No gallop.     Comments: No peripheral edema Pulmonary:     Effort: Pulmonary effort is normal. No respiratory distress.     Breath sounds: Normal breath sounds.  Skin:    General: Skin is warm and dry.     Findings: No rash.  Psychiatric:        Speech:  Speech normal.        Behavior: Behavior normal.        Thought Content: Thought content normal.       Diabetic foot exam: Normal inspection No skin breakdown  Calluses  on bilateral great toes Normal DP pulses Normal sensation to light touch and monofilament Nails normal  Results for orders placed or performed in visit on 05/02/23  HM DIABETES FOOT EXAM  Result Value Ref Range   HM Diabetic Foot Exam done     This visit occurred during the SARS-CoV-2 public health emergency.  Safety protocols were in place, including screening questions prior to the visit, additional usage of staff PPE, and extensive cleaning of exam room while observing appropriate contact time as indicated for disinfecting solutions.   COVID 19 screen:  No recent travel or known exposure to COVID19 The patient denies respiratory symptoms of COVID 19 at this time. The importance of social distancing was discussed today.   Assessment and Plan   The patient's preventative maintenance and recommended screening tests for an annual wellness exam were reviewed in full today. Brought up to date unless services declined.  Counselled on the importance of diet, exercise, and its role in overall health and mortality. The patient's FH and SH was reviewed, including their home life, tobacco status, and drug and alcohol status.    Vaccines:uptodate plan s flu vaccine later Prostate Cancer Screen:  Lab Results  Component Value Date   PSA 1.30 04/30/2023   PSA 0.65 10/13/2021   PSA 0.93 08/12/2020  Colon Cancer Screen:  DUE.. referred to GI      Smoking Status:  former ETOH/ drug use:   3-6 drinks on weekend/none  Hep C:  done  HIV screen:  refused  Problem List Items Addressed This Visit     Controlled type 2 diabetes mellitus without complication, without long-term current use of insulin (HCC)    Excellent control of A1c despite being off metformin entirely.  He will continue working on lifestyle changes.        Relevant Medications   atorvastatin (LIPITOR) 20 MG tablet   Hyperlipidemia associated with type 2 diabetes mellitus (HCC)    Chronic, poor control LDL not at goal less than 657. Restart atorvastatin 20 mg daily      Relevant Medications   atorvastatin (LIPITOR) 20 MG tablet   Overweight with body mass index (BMI) of 29 to 29.9 in adult    Encouraged exercise, weight loss, healthy eating habits.       Other Visit Diagnoses  Routine general medical examination at a health care facility    -  Primary   Colon cancer screening       Relevant Orders   Ambulatory referral to Gastroenterology       Orders Placed This Encounter  Procedures   Ambulatory referral to Gastroenterology    Referral Priority:   Routine    Referral Type:   Consultation    Referral Reason:   Specialty Services Required    Number of Visits Requested:   1   HM DIABETES FOOT EXAM    This external order was created through the Results Console.    Kerby Nora, MD

## 2023-05-02 NOTE — Assessment & Plan Note (Signed)
Excellent control of A1c despite being off metformin entirely.  He will continue working on lifestyle changes.

## 2023-05-02 NOTE — Patient Instructions (Signed)
Work on low [employer].  Decreased alcohol intake.  Restart atorvastatin.  We will call you with the colonoscopy appt.  Get flu shot!

## 2023-06-28 ENCOUNTER — Ambulatory Visit: Admission: RE | Admit: 2023-06-28 | Payer: 59 | Source: Home / Self Care | Admitting: Gastroenterology

## 2023-06-28 SURGERY — COLONOSCOPY WITH PROPOFOL
Anesthesia: General

## 2024-01-07 ENCOUNTER — Ambulatory Visit: Admitting: Family Medicine

## 2024-03-09 ENCOUNTER — Ambulatory Visit: Payer: Self-pay

## 2024-03-09 NOTE — Telephone Encounter (Signed)
 FYI Only or Action Required?: FYI only for provider.  Patient was last seen in primary care on 05/02/2023 by Avelina Greig BRAVO, MD.  Called Nurse Triage reporting Back Pain.  Symptoms began Sat.  Interventions attempted: OTC medications: NSAIDS.  Symptoms are: unchanged.  Triage Disposition: See PCP When Office is Open (Within 3 Days)  Patient/caregiver understands and will follow disposition?: yes       Copied from CRM [id number]. Topic: Clinical - Red Word Triage >> Mar 09, 2024 11:06 AM Suzen RAMAN wrote: Red Word that prompted transfer to Nurse Triage: Back pain, Patient spouse is requesting an X-Ray. Spouse believes patient pulled something in his back on 03/07/24. Reason for Disposition  [1] Pain radiates into the thigh or further down the leg AND [2] one leg  Answer Assessment - Initial Assessment Questions 1. ONSET: When did the pain begin? (e.g., minutes, hours, days)     Sat 2. LOCATION: Where does it hurt? (upper, mid or lower back)     Lower  3. SEVERITY: How bad is the pain?  (e.g., Scale 1-10; mild, moderate, or severe)     8/10 4. PATTERN: Is the pain constant? (e.g., yes, no; constant, intermittent)      Constant  5. RADIATION: Does the pain shoot into your legs or somewhere else?     Right leg  6. CAUSE:  What do you think is causing the back pain?      Carry heavy objects  7. BACK OVERUSE:  Any recent lifting of heavy objects, strenuous work or exercise?     Recent liftng heavy objects 8. MEDICINES: What have you taken so far for the pain? (e.g., nothing, acetaminophen , NSAIDS)     NSAIDS 9. NEUROLOGIC SYMPTOMS: Do you have any weakness, numbness, or problems with bowel/bladder control?     Right numbness to right leg to ankle  10. OTHER SYMPTOMS: Do you have any other symptoms? (e.g., fever, abdomen pain, burning with urination, blood in urine)       no 11. PREGNANCY: Is there any chance you are pregnant? When was your last menstrual  period?       N/a  Protocols used: Back Pain-A-AH

## 2024-03-09 NOTE — Telephone Encounter (Signed)
 Appointment with Dr. Avelina 03/10/2024

## 2024-03-10 ENCOUNTER — Ambulatory Visit: Payer: Self-pay | Admitting: Family Medicine

## 2024-03-10 ENCOUNTER — Encounter: Payer: Self-pay | Admitting: Family Medicine

## 2024-03-10 ENCOUNTER — Ambulatory Visit: Admitting: Family Medicine

## 2024-03-10 ENCOUNTER — Ambulatory Visit (INDEPENDENT_AMBULATORY_CARE_PROVIDER_SITE_OTHER)
Admission: RE | Admit: 2024-03-10 | Discharge: 2024-03-10 | Disposition: A | Discharge status: Home / Self Care | Source: Ambulatory Visit | Attending: Family Medicine | Admitting: Family Medicine

## 2024-03-10 VITALS — BP 130/86 | HR 82 | Temp 97.8°F | Ht 69.0 in | Wt 212.5 lb

## 2024-03-10 DIAGNOSIS — M5441 Lumbago with sciatica, right side: Secondary | ICD-10-CM

## 2024-03-10 DIAGNOSIS — E119 Type 2 diabetes mellitus without complications: Secondary | ICD-10-CM

## 2024-03-10 LAB — POCT GLYCOSYLATED HEMOGLOBIN (HGB A1C): Hemoglobin A1C: 6.1 % — AB (ref 4.0–5.6)

## 2024-03-10 LAB — MICROALBUMIN / CREATININE URINE RATIO
Creatinine,U: 141.5 mg/dL
Microalb Creat Ratio: 272.2 mg/g — ABNORMAL HIGH (ref 0.0–30.0)
Microalb, Ur: 38.5 mg/dL — ABNORMAL HIGH (ref 0.0–1.9)

## 2024-03-10 MED ORDER — TRAMADOL HCL 50 MG PO TABS: 50.0000 mg | ORAL_TABLET | Freq: Three times a day (TID) | ORAL | 0 refills | Status: AC | PRN

## 2024-03-10 MED ORDER — CYCLOBENZAPRINE HCL 10 MG PO TABS: 10.0000 mg | ORAL_TABLET | Freq: Every day | ORAL | 0 refills | Status: AC

## 2024-03-10 MED ORDER — PREDNISONE 20 MG PO TABS: ORAL_TABLET | ORAL | 0 refills | Status: DC

## 2024-03-10 NOTE — Assessment & Plan Note (Signed)
 Acute, no red flags such as incontinence or fever. There is some vertebral tenderness to palpation L3-L4-L5 on exam so I will proceed with a lumbar x-ray. Will treat with prednisone  taper, muscle relaxant at night and tramadol  as needed for breakthrough pain. Start heat on low back and start home physical therapy. Work restrictions with no lifting greater than 10 pounds or repetitive bending or twisting for the next week. Patient will let me know if not improving as expected.  Go to ER if pain severe and uncontrolled.

## 2024-03-10 NOTE — Assessment & Plan Note (Signed)
 Excellent control of A1c despite being off metformin entirely.  He will continue working on lifestyle changes.

## 2024-03-10 NOTE — Progress Notes (Signed)
 Patient ID: Anthony Carroll, male    DOB: 26-Jun-1975, 48 y.o.   MRN: [id number]  This visit was conducted in person.  BP (!) 140/90   Pulse 82   Temp 97.8 F (36.6 C) (Temporal)   Ht 5' 9 (1.753 m)   Wt 212 lb 8 oz (96.4 kg)   SpO2 96%   BMI 31.38 kg/m    CC:  Chief Complaint  Patient presents with   Back Pain    Radiating down Right Leg-Hurt lifting pipes at work on Friday    Subjective:   HPI: Anthony Carroll is a 48 y.o. male with history of controlled type 2 diabetes, BMI 31 and tobacco abuse presenting on 03/10/2024 for Back Pain (Radiating down Right Leg-Hurt lifting pipes at work on Friday)  New onset injury of low back with radiation to right leg. Occurred after lifting heavy pipes at work 5 days ago.  Continuous pain..   Associated with weakness and  tingling in right leg.  Tightness at night.   Ibuprofen  800 mg three times daily , not helpful.  Blood rpessure likely elevated given pain.  No past back imaging. No past back surgeries or issues.    Overdue for DM eval.   Diet controlled. Lab Results  Component Value Date   HGBA1C 6.1 (A) 03/10/2024   Microalbumin pending   Relevant past medical, surgical, family and social history reviewed and updated as indicated. Interim medical history since our last visit reviewed. Allergies and medications reviewed and updated. Outpatient Medications Prior to Visit  Medication Sig Dispense Refill   ACCU-CHEK GUIDE test strip CHECK BLOOD SUGAR DAILY 100 strip 3   cetirizine (ZYRTEC) 10 MG tablet Take 10 mg by mouth daily.     Multiple Vitamins-Minerals (MULTIVITAMIN WITH MINERALS) tablet Take 1 tablet by mouth daily.     atorvastatin  (LIPITOR) 20 MG tablet Take 1 tablet (20 mg total) by mouth every evening. (Patient not taking: Reported on 03/10/2024) 90 tablet 3   pantoprazole  (PROTONIX ) 40 MG tablet Take 1 tablet (40 mg total) by mouth daily. 90 tablet 3   No facility-administered medications prior to visit.     Per HPI  unless specifically indicated in ROS section below Review of Systems  Constitutional:  Negative for fatigue and fever.  HENT:  Negative for ear pain.   Eyes:  Negative for pain.  Respiratory:  Negative for cough and shortness of breath.   Cardiovascular:  Negative for chest pain, palpitations and leg swelling.  Gastrointestinal:  Negative for abdominal pain.  Genitourinary:  Negative for dysuria.  Musculoskeletal:  Positive for back pain and gait problem. Negative for arthralgias.  Neurological:  Negative for syncope, light-headedness and headaches.  Psychiatric/Behavioral:  Negative for dysphoric mood.    Objective:  BP (!) 140/90   Pulse 82   Temp 97.8 F (36.6 C) (Temporal)   Ht 5' 9 (1.753 m)   Wt 212 lb 8 oz (96.4 kg)   SpO2 96%   BMI 31.38 kg/m   Wt Readings from Last 3 Encounters:  03/10/24 212 lb 8 oz (96.4 kg)  05/02/23 215 lb (97.5 kg)  06/19/22 220 lb (99.8 kg)      Physical Exam Vitals reviewed.  Constitutional:      Appearance: He is well-developed.  HENT:     Head: Normocephalic.     Right Ear: Hearing normal.     Left Ear: Hearing normal.     Nose: Nose normal.  Neck:  Thyroid : No thyroid  mass or thyromegaly.     Vascular: No carotid bruit.     Trachea: Trachea normal.  Cardiovascular:     Rate and Rhythm: Normal rate and regular rhythm.     Pulses: Normal pulses.     Heart sounds: Heart sounds not distant. No murmur heard.    No friction rub. No gallop.     Comments: No peripheral edema Pulmonary:     Effort: Pulmonary effort is normal. No respiratory distress.     Breath sounds: Normal breath sounds.  Musculoskeletal:     Lumbar back: Spasms, tenderness and bony tenderness present. Decreased range of motion. Positive right straight leg raise test.  Skin:    General: Skin is warm and dry.     Findings: No rash.  Psychiatric:        Speech: Speech normal.        Behavior: Behavior normal.        Thought Content: Thought content normal.        Results for orders placed or performed in visit on 03/10/24  POCT glycosylated hemoglobin (Hb A1C)   Collection Time: 03/10/24 10:50 AM  Result Value Ref Range   Hemoglobin A1C 6.1 (A) 4.0 - 5.6 %   HbA1c POC (<> result, manual entry)     HbA1c, POC (prediabetic range)     HbA1c, POC (controlled diabetic range)      Assessment and Plan  Controlled type 2 diabetes mellitus without complication, without long-term current use of insulin (HCC) Assessment & Plan: Excellent control of A1c despite being off metformin  entirely.  He will continue working on lifestyle changes.   Orders: -     POCT glycosylated hemoglobin (Hb A1C) -     Microalbumin / creatinine urine ratio  Acute right-sided low back pain with right-sided sciatica Assessment & Plan: Acute, no red flags such as incontinence or fever. There is some vertebral tenderness to palpation L3-L4-L5 on exam so I will proceed with a lumbar x-ray. Will treat with prednisone  taper, muscle relaxant at night and tramadol  as needed for breakthrough pain. Start heat on low back and start home physical therapy. Work restrictions with no lifting greater than 10 pounds or repetitive bending or twisting for the next week. Patient will let me know if not improving as expected.  Go to ER if pain severe and uncontrolled.  Orders: -     DG Lumbar Spine Complete; Future  Other orders -     predniSONE ; 3 tabs by mouth daily x 3 days, then 2 tabs by mouth daily x 2 days then 1 tab by mouth daily x 2 days  Dispense: 15 tablet; Refill: 0 -     traMADol  HCl; Take 1 tablet (50 mg total) by mouth every 8 (eight) hours as needed for moderate pain (pain score 4-6).  Dispense: 20 tablet; Refill: 0 -     Cyclobenzaprine  HCl; Take 1 tablet (10 mg total) by mouth at bedtime.  Dispense: 15 tablet; Refill: 0    Return if symptoms worsen or fail to improve.   Greig Ring, MD

## 2024-03-19 MED ORDER — PREDNISONE 20 MG PO TABS: ORAL_TABLET | ORAL | 0 refills | Status: DC

## 2024-03-30 ENCOUNTER — Ambulatory Visit: Payer: Self-pay

## 2024-03-30 NOTE — Telephone Encounter (Signed)
 Appointment with Dr. Avelina 03/31/24.

## 2024-03-30 NOTE — Telephone Encounter (Signed)
 FYI Only or Action Required?: FYI only for provider.  Patient was last seen in primary care on 03/10/2024 by Avelina Greig BRAVO, MD.  Called Nurse Triage reporting Urine Output.  Symptoms began a week ago.  Interventions attempted: Nothing.  Symptoms are: stable.  Triage Disposition: See PCP Within 2 Weeks  Patient/caregiver understands and will follow disposition?: Yes Reason for Disposition  Urination is difficult to start (i.e., hesitancy) or straining  Answer Assessment - Initial Assessment Questions 1. SYMPTOM: What's the main symptom you're concerned about? (e.g., frequency, incontinence)     Feels like has to urinate and it takes awhile to go, coming out in drops and only sometimes feels like is able to empty bladder fully.   2. ONSET: When did the Trouble urinating start?     Since taking Tramdol and Prednisone , give or take 2 weeks  3. PAIN: Is there any pain? If Yes, ask: How bad is it? (Scale: 1-10; mild, moderate, severe)     Denies  4. CAUSE: What do you think is causing the symptoms?     Denies this happening before, noticed symptoms after starting Tramdol and Prednisone   5. OTHER SYMPTOMS: Do you have any other symptoms? (e.g., blood in urine, fever, flank pain, pain with urination)     Denies  Protocols used: Urinary Symptoms-A-AH  Copied from CRM #[id number]. Topic: Clinical - Red Word Triage >> Mar 30, 2024  9:57 AM Antwanette L wrote: Red Word that prompted transfer to Nurse Triage: The patient reports experiencing sciatic nerve pain in the right leg for the past few weeks. He is currently taking Tramadol  (Ultram ) 50 mg and Prednisone  (Deltasone ) 20 mg  However, he is experiencing difficulty with bowel movements, which may be a side effect of the medications

## 2024-03-31 ENCOUNTER — Encounter: Payer: Self-pay | Admitting: Family Medicine

## 2024-03-31 ENCOUNTER — Ambulatory Visit: Admitting: Family Medicine

## 2024-03-31 VITALS — BP 130/88 | HR 81 | Temp 98.6°F | Ht 69.0 in | Wt 210.2 lb

## 2024-03-31 DIAGNOSIS — M5441 Lumbago with sciatica, right side: Secondary | ICD-10-CM | POA: Diagnosis not present

## 2024-03-31 DIAGNOSIS — R339 Retention of urine, unspecified: Secondary | ICD-10-CM | POA: Diagnosis not present

## 2024-03-31 LAB — POC URINALSYSI DIPSTICK (AUTOMATED)
Bilirubin, UA: NEGATIVE
Blood, UA: NEGATIVE
Glucose, UA: NEGATIVE
Ketones, UA: NEGATIVE
Leukocytes, UA: NEGATIVE
Nitrite, UA: NEGATIVE
Protein, UA: POSITIVE — AB
Spec Grav, UA: 1.025 (ref 1.010–1.025)
Urobilinogen, UA: 0.2 U/dL
pH, UA: 6 (ref 5.0–8.0)

## 2024-03-31 MED ORDER — MELOXICAM 15 MG PO TABS: 15.0000 mg | ORAL_TABLET | Freq: Every day | ORAL | 0 refills | Status: AC

## 2024-03-31 MED ORDER — TAMSULOSIN HCL 0.4 MG PO CAPS: 0.4000 mg | ORAL_CAPSULE | Freq: Every day | ORAL | 0 refills | Status: AC

## 2024-03-31 NOTE — Progress Notes (Signed)
 Patient ID: Anthony Carroll, male    DOB: 18-Apr-1976, 48 y.o.   MRN: [id number]  This visit was conducted in person.  BP 130/88   Pulse 81   Temp 98.6 F (37 C) (Temporal)   Ht 5' 9 (1.753 m)   Wt 210 lb 4 oz (95.4 kg)   SpO2 97%   BMI 31.05 kg/m    CC:  Chief Complaint  Patient presents with   Urinary Retention   Leg Pain    Right Leg    Subjective:   HPI: Anthony Carroll is a 48 y.o. male with BMI 31, history of diet-controlled diabetes. presenting on 03/31/2024 for Urinary Retention and Leg Pain (Right Leg)  New onset urinary retention.. has to push to get urine out... noted  start while on prednisone .  No dysuria. No urgency, no frequency.       Seen on 9/23 with low back injury... treated with prednisone , cyclobenzaprine  and tramadol .   Now on week 4 of Low back pain, He report the back  is better but  still having shooting pain in  right leg, constant.  Spasm in  right leg.   Tingling in leg with walking.  Trying to walk on treadmill.   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   Lumbar X-ray: MPRESSION: 1. No acute fracture. 2. 4 mm retrolisthesis L5-S1 with associated mild degenerative disc disease.   Relevant past medical, surgical, family and social history reviewed and updated as indicated. Interim medical history since our last visit reviewed. Allergies and medications reviewed and updated. Outpatient Medications Prior to Visit  Medication Sig Dispense Refill   ACCU-CHEK GUIDE test strip CHECK BLOOD SUGAR DAILY 100 strip 3   cetirizine (ZYRTEC) 10 MG tablet Take 10 mg by mouth daily.     cyclobenzaprine  (FLEXERIL ) 10 MG tablet Take 1 tablet (10 mg total) by mouth at bedtime. 15 tablet 0   Multiple Vitamins-Minerals (MULTIVITAMIN WITH MINERALS) tablet Take 1 tablet by mouth daily.     traMADol  (ULTRAM ) 50 MG tablet Take 1 tablet (50 mg total) by mouth every 8 (eight) hours as needed for moderate pain (pain score 4-6).  20 tablet 0   atorvastatin  (LIPITOR) 20 MG tablet Take 1 tablet (20 mg total) by mouth every evening. (Patient not taking: Reported on 03/31/2024) 90 tablet 3   predniSONE  (DELTASONE ) 20 MG tablet 2 tabs by mouth daily x 5 days, then 1 tab by mouth daily x 5 days 15 tablet 0   No facility-administered medications prior to visit.     Per HPI unless specifically indicated in ROS section below Review of Systems  Constitutional:  Negative for fatigue and fever.  HENT:  Negative for ear pain.   Eyes:  Negative for pain.  Respiratory:  Negative for cough and shortness of breath.   Cardiovascular:  Negative for chest pain, palpitations and leg swelling.  Gastrointestinal:  Negative for abdominal pain.  Genitourinary:  Positive for difficulty urinating. Negative for decreased urine volume, dysuria, genital sores, hematuria, penile discharge, penile pain, penile swelling, testicular pain and urgency.  Musculoskeletal:  Negative for arthralgias.  Neurological:  Negative for syncope, light-headedness and headaches.  Psychiatric/Behavioral:  Negative for dysphoric mood.    Objective:  BP 130/88   Pulse 81   Temp 98.6 F (37 C) (Temporal)   Ht 5' 9 (1.753 m)   Wt 210 lb 4 oz (95.4 kg)   SpO2 97%  BMI 31.05 kg/m   Wt Readings from Last 3 Encounters:  03/31/24 210 lb 4 oz (95.4 kg)  03/10/24 212 lb 8 oz (96.4 kg)  05/02/23 215 lb (97.5 kg)      Physical Exam Vitals reviewed.  Constitutional:      Appearance: He is well-developed.  HENT:     Head: Normocephalic.     Right Ear: Hearing normal.     Left Ear: Hearing normal.     Nose: Nose normal.  Neck:     Thyroid : No thyroid  mass or thyromegaly.     Vascular: No carotid bruit.     Trachea: Trachea normal.  Cardiovascular:     Rate and Rhythm: Normal rate and regular rhythm.     Pulses: Normal pulses.     Heart sounds: Heart sounds not distant. No murmur heard.    No friction rub. No gallop.     Comments: No peripheral  edema Pulmonary:     Effort: Pulmonary effort is normal. No respiratory distress.     Breath sounds: Normal breath sounds.  Musculoskeletal:     Lumbar back: Tenderness present. No spasms or bony tenderness. Decreased range of motion. Negative right straight leg raise test and negative left straight leg raise test.  Skin:    General: Skin is warm and dry.     Findings: No rash.  Psychiatric:        Speech: Speech normal.        Behavior: Behavior normal.        Thought Content: Thought content normal.       Results for orders placed or performed in visit on 03/31/24  POCT Urinalysis Dipstick (Automated)   Collection Time: 03/31/24 11:20 AM  Result Value Ref Range   Color, UA Yellow    Clarity, UA Clear    Glucose, UA Negative Negative   Bilirubin, UA Negative    Ketones, UA Negative    Spec Grav, UA 1.025 1.010 - 1.025   Blood, UA Negative    pH, UA 6.0 5.0 - 8.0   Protein, UA Positive (A) Negative   Urobilinogen, UA 0.2 0.2 or 1.0 E.U./dL   Nitrite, UA Negative    Leukocytes, UA Negative Negative    Assessment and Plan  Acute right-sided low back pain with right-sided sciatica Assessment & Plan:  Acute, going x 4 weeks.  Some initial improvement with prednisone , cyclobenzaprine  and tramadol . Continuing home physical therapy.  Has changed job at work. Will move forward with formal physical therapy referral. Start meloxicam 15 mg p.o. daily  Return and ER precautions provided.  Orders: -     Ambulatory referral to Physical Therapy  Urinary retention Assessment & Plan: Acute, likely secondary to prednisone .  He has noted minimal improvement since being off prednisone  course in the last week. Urinalysis clear and shows no sign of infection.  No prostatitis symptoms. Recommend Flomax to improve urine flow as well as time and fluids.   If not improving consider referral to urology.    Orders: -     POCT Urinalysis Dipstick (Automated)  Other orders -      Tamsulosin HCl; Take 1 capsule (0.4 mg total) by mouth daily.  Dispense: 20 capsule; Refill: 0 -     Meloxicam; Take 1 tablet (15 mg total) by mouth daily.  Dispense: 30 tablet; Refill: 0    No follow-ups on file.   Greig Ring, MD

## 2024-03-31 NOTE — Assessment & Plan Note (Addendum)
 Acute, likely secondary to prednisone .  He has noted minimal improvement since being off prednisone  course in the last week. Urinalysis clear and shows no sign of infection.  No prostatitis symptoms. Recommend Flomax to improve urine flow as well as time and fluids.   If not improving consider referral to urology.

## 2024-04-01 NOTE — Therapy (Incomplete)
 OUTPATIENT PHYSICAL THERAPY THORACOLUMBAR EVALUATION   Patient Name: Anthony Carroll MRN: [id number] DOB:1975-12-19, 48 y.o., male Today's Date: 04/01/2024  END OF SESSION:   Past Medical History:  Diagnosis Date   Eczema    Glaucoma    Past Surgical History:  Procedure Laterality Date   ANKLE SURGERY     left 1999   Patient Active Problem List   Diagnosis Date Noted   Urinary retention 03/31/2024   Acute right-sided low back pain with right-sided sciatica 03/10/2024   Controlled type 2 diabetes mellitus without complication, without long-term current use of insulin (HCC) 08/16/2019   Overweight with body mass index (BMI) of 29 to 29.9 in adult 08/16/2019   Hyperlipidemia associated with type 2 diabetes mellitus (HCC) 02/15/2018   Seasonal and perennial allergic rhinitis 09/02/2017   Allergic conjunctivitis 09/02/2017   Oral allergy syndrome 09/02/2017   Atopic dermatitis 09/02/2017   Chest pain 04/24/2017    PCP: Avelina Greig BRAVO, MD   REFERRING PROVIDER: Avelina Greig BRAVO, MD  REFERRING DIAG:  M54.41 (ICD-10-CM) - Acute right-sided low back pain with right-sided sciatica  Rationale for Evaluation and Treatment: Rehabilitation  THERAPY DIAG:  No diagnosis found.  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 Pain is associated with weakness and tingling in R leg.  PAIN:  Are you having pain? {OPRCPAIN:27236}  PRECAUTIONS: None  RED FLAGS: {PT Red Flags:29287}   WEIGHT BEARING RESTRICTIONS: No  FALLS:  Has patient fallen in last 6 months? No  LIVING ENVIRONMENT: Lives with: {OPRC lives with:25569::lives with their  family} Lives in: {Lives in:25570} Stairs: {opstairs:27293} Has following equipment at home: {Assistive devices:23999}  OCCUPATION: ***  PLOF: {PLOF:24004}  PATIENT GOALS: ***  NEXT MD VISIT: ***  OBJECTIVE:  Note: Objective measures were completed at Evaluation unless otherwise noted.  DIAGNOSTIC FINDINGS:  EXAM: 4 VIEW(S) XRAY OF THE LUMBAR SPINE 03/10/2024 11:22:56 AM IMPRESSION: 1. No acute fracture. 2. 4 mm retrolisthesis L5-S1 with associated mild degenerative disc disease.  PATIENT SURVEYS:  Modified Oswestry:  MODIFIED OSWESTRY DISABILITY SCALE  Date: *** Score  Pain intensity {ODI 1:32962}  2. Personal care (washing, dressing, etc.) {ODI 2:32963}  3. Lifting {ODI 3:32964}  4. Walking {ODI 4:32965}  5. Sitting {ODI 5:32966}  6. Standing {ODI 6:32967}  7. Sleeping {ODI 7:32968}  8. Social Life {ODI 8:32969}  9. Traveling {ODI 9:32970}  10. Employment/ Homemaking {ODI 10:32971}  Total ***/50   Interpretation of scores: Score Category Description  0-20% Minimal Disability The patient can cope with most living activities. Usually no treatment is indicated apart from advice on lifting, sitting and exercise  21-40% Moderate Disability The patient experiences more pain and difficulty with sitting, lifting and standing. Travel and social life are more difficult and they may be disabled from work. Personal care, sexual activity and sleeping are not grossly affected, and the patient can usually be managed by conservative means  41-60% Severe Disability Pain remains the main problem in this group, but activities of daily living are affected. These patients require a detailed investigation  61-80% Crippled Back pain impinges on all aspects of the patient's life. Positive intervention is required  81-100% Bed-bound These patients are either bed-bound or exaggerating their symptoms  Bluford FORBES Zoe DELENA Karon DELENA, et al. Surgery versus conservative management of stable  thoracolumbar fracture: the PRESTO feasibility RCT. [place]): [employer]; 2021 Nov. [facility] Technology Assessment, No. 25.62.) Appendix 3, Oswestry Disability Index category descriptors. Available from: [employer]el[employer]cz  Minimally Clinically Important Difference (MCID) = 12.8%  COGNITION: Overall cognitive status: {cognition:24006}     SENSATION: {sensation:27233}  MUSCLE LENGTH: Hamstrings: Right *** deg; Left *** deg Debby test: Right *** deg; Left *** deg  POSTURE: {posture:25561}  PALPATION: ***  LUMBAR ROM:   AROM eval  Flexion   Extension   Right lateral flexion   Left lateral flexion   Right rotation   Left rotation    (Blank rows = not tested)  LOWER EXTREMITY ROM:     {AROM/PROM:27142}  Right eval Left eval  Hip flexion    Hip extension    Hip abduction    Hip adduction    Hip internal rotation    Hip external rotation    Knee flexion    Knee extension    Ankle dorsiflexion    Ankle plantarflexion    Ankle inversion    Ankle eversion     (Blank rows = not tested)  LOWER EXTREMITY MMT:    MMT Right eval Left eval  Hip flexion    Hip extension    Hip abduction    Hip adduction    Hip internal rotation    Hip external rotation    Knee flexion    Knee extension    Ankle dorsiflexion    Ankle plantarflexion    Ankle inversion    Ankle eversion     (Blank rows = not tested)  LUMBAR SPECIAL TESTS:  Straight leg raise test: {pos/neg:25243} and FABER test: {pos/neg:25243}  FUNCTIONAL TESTS:  5 times sit to stand: *** 6 minute walk test: ***  GAIT: Distance walked: *** Assistive device utilized: {Assistive devices:23999} Level of assistance: {Levels of assistance:24026} Comments: ***  TREATMENT DATE: 04/01/24  PATIENT EDUCATION:  Education details: HEP, POC,  goals  Person educated: Patient Education method: Explanation, Demonstration, and Handouts Education comprehension: verbalized understanding and returned demonstration  HOME EXERCISE PROGRAM: ***  ASSESSMENT:  CLINICAL IMPRESSION: Patient is a 48 y.o. male who was seen today for physical therapy evaluation and treatment for ***.   OBJECTIVE IMPAIRMENTS: {opptimpairments:25111}.   ACTIVITY LIMITATIONS: {activitylimitations:27494}  PARTICIPATION LIMITATIONS: {participationrestrictions:25113}  PERSONAL FACTORS: {Personal factors:25162} are also affecting patient's functional outcome.   REHAB POTENTIAL: Good  CLINICAL DECISION MAKING: Stable/uncomplicated  EVALUATION COMPLEXITY: Low   GOALS: Goals reviewed with patient? Yes  SHORT TERM GOALS: Target date: 04/30/2024  *** Baseline: Goal status: INITIAL  2.  *** Baseline:  Goal status: INITIAL  3.  *** Baseline:  Goal status: INITIAL  4.  *** Baseline:  Goal status: INITIAL  5.  *** Baseline:  Goal status: INITIAL  6.  *** Baseline:  Goal status: INITIAL  LONG TERM GOALS: Target date: 05/28/2024  *** Baseline:  Goal status: INITIAL  2.  *** Baseline:  Goal status: INITIAL  3.  *** Baseline:  Goal status: INITIAL  4.  *** Baseline:  Goal status: INITIAL  5.  *** Baseline:  Goal status: INITIAL  6.  *** Baseline:  Goal status: INITIAL  PLAN:  PT FREQUENCY: 1-2x/week  PT DURATION: 8 weeks  PLANNED INTERVENTIONS: 97164- PT Re-evaluation, 97750- Physical Performance Testing, 97110-Therapeutic exercises, 97530- Therapeutic activity, [id number]- Neuromuscular re-education, 97535- Self Care, 02859- Manual therapy, [id number]- Gait training, H9716- Electrical stimulation (unattended), [id number]- Electrical stimulation (manual), 20560 (1-2 muscles), 20561 (3+ muscles)- Dry Needling, Patient/Family education, Stair training, Spinal manipulation, Spinal mobilization, Cryotherapy, and Moist heat.  PLAN FOR  NEXT SESSION: ***   Maryanne Finder, PT, DPT Physical Therapist - [place]  [facility] 04/01/2024, 10:18 AM

## 2024-04-02 ENCOUNTER — Ambulatory Visit

## 2024-04-04 NOTE — Assessment & Plan Note (Addendum)
 Acute, going x 4 weeks.  Some initial improvement with prednisone , cyclobenzaprine  and tramadol . Continuing home physical therapy.  Has changed job at work. Will move forward with formal physical therapy referral. Start meloxicam 15 mg p.o. daily  Return and ER precautions provided.

## 2024-04-06 NOTE — Therapy (Incomplete)
 OUTPATIENT PHYSICAL THERAPY THORACOLUMBAR EVALUATION   Patient Name: Armour Villanueva MRN: [id number] DOB:06-04-1976, 48 y.o., male Today's Date: 04/06/2024  END OF SESSION:   Past Medical History:  Diagnosis Date   Eczema    Glaucoma    Past Surgical History:  Procedure Laterality Date   ANKLE SURGERY     left 1999   Patient Active Problem List   Diagnosis Date Noted   Urinary retention 03/31/2024   Acute right-sided low back pain with right-sided sciatica 03/10/2024   Controlled type 2 diabetes mellitus without complication, without long-term current use of insulin (HCC) 08/16/2019   Overweight with body mass index (BMI) of 29 to 29.9 in adult 08/16/2019   Hyperlipidemia associated with type 2 diabetes mellitus (HCC) 02/15/2018   Seasonal and perennial allergic rhinitis 09/02/2017   Allergic conjunctivitis 09/02/2017   Oral allergy syndrome 09/02/2017   Atopic dermatitis 09/02/2017   Chest pain 04/24/2017    PCP: Avelina Greig BRAVO, MD   REFERRING PROVIDER: Avelina Greig BRAVO, MD  REFERRING DIAG:  M54.41 (ICD-10-CM) - Acute right-sided low back pain with right-sided sciatica  Rationale for Evaluation and Treatment: Rehabilitation  THERAPY DIAG:  No diagnosis found.  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 Pain is associated with weakness and tingling in R leg.  PAIN:  Are you having pain? Yes: NPRS scale: *** Pain location: *** Pain description: *** Aggravating factors: *** Relieving factors: ***  PRECAUTIONS: None  RED FLAGS: {PT Red Flags:29287}   WEIGHT BEARING RESTRICTIONS: No  FALLS:  Has patient fallen in last 6  months? No  LIVING ENVIRONMENT: Lives with: {[facility] lives with:25569::lives with their family} Lives in: {Lives in:25570} Stairs: {opstairs:27293} Has following equipment at home: {Assistive devices:23999}  OCCUPATION: ***  PLOF: {PLOF:24004}  PATIENT GOALS: ***   OBJECTIVE:  Note: Objective measures were completed at Evaluation unless otherwise noted.  DIAGNOSTIC FINDINGS:  EXAM: 4 VIEW(S) XRAY OF THE LUMBAR SPINE 03/10/2024 11:22:56 AM IMPRESSION: 1. No acute fracture. 2. 4 mm retrolisthesis L5-S1 with associated mild degenerative disc disease.  PATIENT SURVEYS:  Modified Oswestry:  MODIFIED OSWESTRY DISABILITY SCALE  Date: *** Score  Pain intensity {ODI 1:32962}  2. Personal care (washing, dressing, etc.) {ODI 2:32963}  3. Lifting {ODI 3:32964}  4. Walking {ODI  4:32965}  5. Sitting {ODI 5:32966}  6. Standing {ODI 6:32967}  7. Sleeping {ODI 7:32968}  8. Social Life {ODI 8:32969}  9. Traveling {ODI 9:32970}  10. Employment/ Homemaking {ODI 10:32971}  Total ***/50   Interpretation of scores: Score Category Description  0-20% Minimal Disability The patient can cope with most living activities. Usually no treatment is indicated apart from advice on lifting, sitting and exercise  21-40% Moderate Disability The patient experiences more pain and difficulty with sitting, lifting and standing. Travel and social life are more difficult and they may be disabled from work. Personal care, sexual activity and sleeping are not grossly affected, and the patient can usually be managed by conservative means  41-60% Severe Disability Pain remains the main problem in this group, but activities of daily living are affected. These patients require a detailed investigation  61-80% Crippled Back pain impinges on all aspects of the patient's life. Positive intervention is required  81-100% Bed-bound These patients are either bed-bound or exaggerating their symptoms  Bluford FORBES Zoe DELENA Karon DELENA, et al. Surgery versus conservative management of stable thoracolumbar fracture: the PRESTO feasibility RCT. [place] ([place]): [employer]; 2021 Nov. [facility] Technology Assessment, No. 25.62.) Appendix 3, Oswestry Disability Index category descriptors. Available from: [employer]cz  Minimally Clinically Important Difference (MCID) = 12.8%  COGNITION: Overall cognitive status: Within functional limits for tasks assessed     SENSATION: {sensation:27233}  MUSCLE LENGTH: Hamstrings: Right *** deg; Left *** deg Debby test: Right *** deg; Left *** deg  POSTURE: {posture:25561}  PALPATION: ***  LUMBAR ROM:   AROM eval  Flexion   Extension   Right lateral flexion   Left lateral flexion   Right rotation   Left rotation    (Blank rows = not tested)  LOWER EXTREMITY ROM:     {AROM/PROM:27142}  Right eval Left eval  Hip flexion    Hip extension    Hip abduction    Hip adduction    Hip internal rotation    Hip external rotation    Knee flexion    Knee extension    Ankle dorsiflexion    Ankle plantarflexion    Ankle inversion    Ankle eversion     (Blank rows = not tested)  LOWER EXTREMITY MMT:    MMT Right eval Left eval  Hip flexion    Hip extension    Hip abduction    Hip adduction    Hip internal rotation    Hip external rotation    Knee flexion    Knee extension    Ankle dorsiflexion    Ankle plantarflexion    Ankle inversion    Ankle eversion     (Blank rows = not tested)  LUMBAR SPECIAL TESTS:  Straight leg raise test: {pos/neg:25243} and FABER test: {pos/neg:25243}  FUNCTIONAL TESTS:  5 times sit to stand: *** 6 minute walk test: ***  GAIT: Distance walked: *** Assistive device utilized: {Assistive devices:23999} Level of assistance: {Levels of assistance:24026} Comments: ***  TREATMENT DATE: 04/06/24  PATIENT EDUCATION:  Education details: HEP, POC, goals  Person educated: Patient Education method: Explanation, Demonstration, and Handouts Education comprehension: verbalized understanding and returned demonstration  HOME EXERCISE PROGRAM: ***  ASSESSMENT:  CLINICAL IMPRESSION: Patient is a 49 y.o. male who was seen today for physical therapy evaluation and treatment for ***.   OBJECTIVE IMPAIRMENTS: {opptimpairments:25111}.   ACTIVITY LIMITATIONS: {activitylimitations:27494}  PARTICIPATION LIMITATIONS: {participationrestrictions:25113}  PERSONAL FACTORS: {Personal factors:25162} are also affecting patient's functional outcome.   REHAB POTENTIAL: Good  CLINICAL DECISION MAKING: Stable/uncomplicated  EVALUATION COMPLEXITY: Low   GOALS: Goals reviewed with patient? Yes  SHORT TERM GOALS: Target date: 04/30/2024  Patient will be independent in HEP to improve strength/mobility for better functional independence with ADLs. Baseline: 04/02/24: HEP initiated  Goal status: INITIAL   LONG TERM GOALS: Target date: 05/28/2024  Patient will reduce modified Oswestry score to <20 as to demonstrate minimal disability with ADLs including improved sleeping tolerance, walking/sitting tolerance etc for better mobility with ADLs.  Baseline: 04/02/24: ***  Goal status: INITIAL  2.  Patient will report a worst pain of 3/10 on NRPS in low back to improve tolerance with ADLs and reduced symptoms with activities.  Baseline: 04/02/24: ***  Goal status: INITIAL  3.  Patient (< 60 years old) will complete five times sit to stand test in < 10 seconds indicating an increased LE strength and improved balance. Baseline: 04/02/24: ***  Goal status: INITIAL  4.  Patient will demonstrate proper lifting technique of lifting 30-40 pound box 10 times to improve ability to perform work and home duties.  Baseline:  Goal status: INITIAL  5.  *** Baseline:   Goal status: INITIAL  6.  *** Baseline:  Goal status: INITIAL  PLAN:  PT FREQUENCY: 1-2x/week  PT DURATION: 8 weeks  PLANNED INTERVENTIONS: 97164- PT Re-evaluation, 97750- Physical Performance Testing, 97110-Therapeutic exercises, 97530- Therapeutic activity, [id number]- Neuromuscular re-education, 97535- Self Care, 02859- Manual therapy, [id number]- Gait training, [phone]- Electrical stimulation (unattended), [id number]- Electrical stimulation (manual), 20560 (1-2 muscles), 20561 (3+ muscles)- Dry Needling, Patient/Family education, Stair training, Spinal manipulation, Spinal mobilization, Cryotherapy, and Moist heat.  PLAN FOR NEXT SESSION: ***   Maryanne Finder, PT, DPT Physical Therapist - [place]  [facility] 04/06/2024, 8:34 AM

## 2024-04-07 ENCOUNTER — Ambulatory Visit

## 2024-04-08 NOTE — Therapy (Incomplete)
 OUTPATIENT PHYSICAL THERAPY THORACOLUMBAR EVALUATION   Patient Name: Anthony Carroll MRN: [id number] DOB:1976-05-27, 48 y.o., male Today's Date: 04/08/2024  END OF SESSION:   Past Medical History:  Diagnosis Date   Eczema    Glaucoma    Past Surgical History:  Procedure Laterality Date   ANKLE SURGERY     left 1999   Patient Active Problem List   Diagnosis Date Noted   Urinary retention 03/31/2024   Acute right-sided low back pain with right-sided sciatica 03/10/2024   Controlled type 2 diabetes mellitus without complication, without long-term current use of insulin (HCC) 08/16/2019   Overweight with body mass index (BMI) of 29 to 29.9 in adult 08/16/2019   Hyperlipidemia associated with type 2 diabetes mellitus (HCC) 02/15/2018   Seasonal and perennial allergic rhinitis 09/02/2017   Allergic conjunctivitis 09/02/2017   Oral allergy syndrome 09/02/2017   Atopic dermatitis 09/02/2017   Chest pain 04/24/2017    PCP: Avelina Greig BRAVO, MD   REFERRING PROVIDER: Avelina Greig BRAVO, MD  REFERRING DIAG:  M54.41 (ICD-10-CM) - Acute right-sided low back pain with right-sided sciatica  Rationale for Evaluation and Treatment: Rehabilitation  THERAPY DIAG:  No diagnosis found.  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 Pain is associated with weakness and tingling in R leg.  PAIN:  Are you having pain? Yes: NPRS scale: *** Pain location: *** Pain description: *** Aggravating factors: *** Relieving factors: ***  PRECAUTIONS: None  RED FLAGS: {PT Red Flags:29287}   WEIGHT BEARING RESTRICTIONS: No  FALLS:  Has patient fallen in last 6  months? No  LIVING ENVIRONMENT: Lives with: {[facility] lives with:25569::lives with their family} Lives in: {Lives in:[address]70} Stairs: {opstairs:27293} Has following equipment at home: {Assistive devices:23999}  OCCUPATION: ***  PLOF: {PLOF:24004}  PATIENT GOALS: ***   OBJECTIVE:  Note: Objective measures were completed at Evaluation unless otherwise noted.  DIAGNOSTIC FINDINGS:  EXAM: 4 VIEW(S) XRAY OF THE LUMBAR SPINE 03/10/2024 11:22:56 AM IMPRESSION: 1. No acute fracture. 2. 4 mm retrolisthesis L5-S1 with associated mild degenerative disc disease.  PATIENT SURVEYS:  Modified Oswestry:  MODIFIED OSWESTRY DISABILITY SCALE  Date: *** Score  Pain intensity {ODI 1:32962}  2. Personal care (washing, dressing, etc.) {ODI 2:32963}  3. Lifting {ODI 3:32964}  4. Walking {ODI  4:32965}  5. Sitting {ODI 5:32966}  6. Standing {ODI 6:32967}  7. Sleeping {ODI 7:32968}  8. Social Life {ODI 8:32969}  9. Traveling {ODI 9:32970}  10. Employment/ Homemaking {ODI 10:32971}  Total ***/50   Interpretation of scores: Score Category Description  0-20% Minimal Disability The patient can cope with most living activities. Usually no treatment is indicated apart from advice on lifting, sitting and exercise  21-40% Moderate Disability The patient experiences more pain and difficulty with sitting, lifting and standing. Travel and social life are more difficult and they may be disabled from work. Personal care, sexual activity and sleeping are not grossly affected, and the patient can usually be managed by conservative means  41-60% Severe Disability Pain remains the main problem in this group, but activities of daily living are affected. These patients require a detailed investigation  61-80% Crippled Back pain impinges on all aspects of the patient's life. Positive intervention is required  81-100% Bed-bound These patients are either bed-bound or exaggerating their symptoms  Bluford FORBES Zoe DELENA Karon DELENA, et al. Surgery versus conservative management of stable thoracolumbar fracture: the PRESTO feasibility RCT. [place] ([place]): [employer]; 2021 Nov. [facility] Technology Assessment, No. 25.62.) Appendix 3, Oswestry Disability Index category descriptors. Available from: [employer]cz  Minimally Clinically Important Difference (MCID) = 12.8%  COGNITION: Overall cognitive status: Within functional limits for tasks assessed     SENSATION: {sensation:27233}  MUSCLE LENGTH: Hamstrings: Right *** deg; Left *** deg Debby test: Right *** deg; Left *** deg  POSTURE: {posture:25561}  PALPATION: ***  LUMBAR ROM:   AROM eval  Flexion   Extension   Right lateral flexion   Left lateral flexion   Right rotation   Left rotation    (Blank rows = not tested)  LOWER EXTREMITY ROM:     {AROM/PROM:27142}  Right eval Left eval  Hip flexion    Hip extension    Hip abduction    Hip adduction    Hip internal rotation    Hip external rotation    Knee flexion    Knee extension    Ankle dorsiflexion    Ankle plantarflexion    Ankle inversion    Ankle eversion     (Blank rows = not tested)  LOWER EXTREMITY MMT:    MMT Right eval Left eval  Hip flexion    Hip extension    Hip abduction    Hip adduction    Hip internal rotation    Hip external rotation    Knee flexion    Knee extension    Ankle dorsiflexion    Ankle plantarflexion    Ankle inversion    Ankle eversion     (Blank rows = not tested)  LUMBAR SPECIAL TESTS:  Straight leg raise test: {pos/neg:25243} and FABER test: {pos/neg:25243}  FUNCTIONAL TESTS:  5 times sit to stand: *** 6 minute walk test: ***  GAIT: Distance walked: *** Assistive device utilized: {Assistive devices:23999} Level of assistance: {Levels of assistance:24026} Comments: ***  TREATMENT DATE: 04/08/24  PATIENT EDUCATION:  Education details: HEP, POC, goals  Person educated: Patient Education method: Explanation, Demonstration, and Handouts Education comprehension: verbalized understanding and returned demonstration  HOME EXERCISE PROGRAM: ***  ASSESSMENT:  CLINICAL IMPRESSION: Patient is a 48 y.o. male who was seen today for physical therapy evaluation and treatment for ***.   OBJECTIVE IMPAIRMENTS: {opptimpairments:25111}.   ACTIVITY LIMITATIONS: {activitylimitations:27494}  PARTICIPATION LIMITATIONS: {participationrestrictions:25113}  PERSONAL FACTORS: {Personal factors:25162} are also affecting patient's functional outcome.   REHAB POTENTIAL: Good  CLINICAL DECISION MAKING: Stable/uncomplicated  EVALUATION COMPLEXITY: Low   GOALS: Goals reviewed with patient? Yes  SHORT TERM GOALS: Target date: 04/30/2024  Patient will be independent in HEP to improve strength/mobility for better functional independence with ADLs. Baseline: 04/02/24: HEP initiated  Goal status: INITIAL   LONG TERM GOALS: Target date: 05/28/2024  Patient will reduce modified Oswestry score to <20 as to demonstrate minimal disability with ADLs including improved sleeping tolerance, walking/sitting tolerance etc for better mobility with ADLs.  Baseline: 04/02/24: ***  Goal status: INITIAL  2.  Patient will report a worst pain of 3/10 on NRPS in low back to improve tolerance with ADLs and reduced symptoms with activities.  Baseline: 04/02/24: ***  Goal status: INITIAL  3.  Patient (< 46 years old) will complete five times sit to stand test in < 10 seconds indicating an increased LE strength and improved balance. Baseline: 04/02/24: ***  Goal status: INITIAL  4.  Patient will demonstrate proper lifting technique of lifting 30-40 pound box 10 times to improve ability to perform work and home duties.  Baseline:  Goal status: INITIAL  5.  *** Baseline:   Goal status: INITIAL  6.  *** Baseline:  Goal status: INITIAL  PLAN:  PT FREQUENCY: 1-2x/week  PT DURATION: 8 weeks  PLANNED INTERVENTIONS: 97164- PT Re-evaluation, 97750- Physical Performance Testing, 97110-Therapeutic exercises, 97530- Therapeutic activity, [id number]- Neuromuscular re-education, 97535- Self Care, 02859- Manual therapy, [id number]- Gait training, [phone]- Electrical stimulation (unattended), [id number]- Electrical stimulation (manual), 20560 (1-2 muscles), 20561 (3+ muscles)- Dry Needling, Patient/Family education, Stair training, Spinal manipulation, Spinal mobilization, Cryotherapy, and Moist heat.  PLAN FOR NEXT SESSION: ***   Maryanne Finder, PT, DPT Physical Therapist - [place]  [facility] 04/08/2024, 11:05 AM

## 2024-04-09 ENCOUNTER — Ambulatory Visit

## 2024-04-09 ENCOUNTER — Ambulatory Visit: Admitting: Physical Therapy

## 2024-04-13 ENCOUNTER — Ambulatory Visit

## 2024-04-15 ENCOUNTER — Ambulatory Visit

## 2024-04-21 ENCOUNTER — Ambulatory Visit

## 2024-04-27 ENCOUNTER — Ambulatory Visit

## 2024-04-29 ENCOUNTER — Ambulatory Visit

## 2024-05-04 ENCOUNTER — Ambulatory Visit

## 2024-05-06 ENCOUNTER — Ambulatory Visit

## 2024-05-07 NOTE — Therapy (Incomplete)
 OUTPATIENT PHYSICAL THERAPY THORACOLUMBAR EVALUATION   Patient Name: Anthony Carroll MRN: [id number] DOB:1976-05-18, 48 y.o., male Today's Date: 05/07/2024  END OF SESSION:   Past Medical History:  Diagnosis Date   Eczema    Glaucoma    Past Surgical History:  Procedure Laterality Date   ANKLE SURGERY     left 1999   Patient Active Problem List   Diagnosis Date Noted   Urinary retention 03/31/2024   Acute right-sided low back pain with right-sided sciatica 03/10/2024   Controlled type 2 diabetes mellitus without complication, without long-term current use of insulin (HCC) 08/16/2019   Overweight with body mass index (BMI) of 29 to 29.9 in adult 08/16/2019   Hyperlipidemia associated with type 2 diabetes mellitus (HCC) 02/15/2018   Seasonal and perennial allergic rhinitis 09/02/2017   Allergic conjunctivitis 09/02/2017   Oral allergy syndrome 09/02/2017   Atopic dermatitis 09/02/2017   Chest pain 04/24/2017    PCP: Avelina Greig BRAVO, MD   REFERRING PROVIDER: Avelina Greig BRAVO, MD  REFERRING DIAG:  M54.41 (ICD-10-CM) - Acute right-sided low back pain with right-sided sciatica  Rationale for Evaluation and Treatment: Rehabilitation  THERAPY DIAG:  No diagnosis found.  ONSET DATE: 02/2024  SUBJECTIVE:                                                                                                                                                                                           SUBJECTIVE STATEMENT: ***  PERTINENT HISTORY:  Mr. Vance Hochmuth is a 48 y.o. male presenting to [facility] with lower back pain radiating into his RLE. 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 He reports that the pain will radiate into his R leg and the pain is described as shooting and tingling.   Patient presented for back pain with pain  radiating down R leg after lifting pipes at work. Pain is associated with weakness and tingling in R leg.  PAIN:  Are you having pain? Yes: NPRS scale: Present /10, Best /10, Worst  /10 Pain location: *** Pain description: *** Aggravating factors: *** Relieving factors: Prednisone , Cyclobenzaprine , tramadol   PRECAUTIONS: None  RED FLAGS: Patient denies b/b dysfunction, saddle parasthesia, and direct trauma to the lower back.    WEIGHT BEARING RESTRICTIONS: No  FALLS:  Has patient fallen in last 6 months? No  LIVING ENVIRONMENT: Lives with: {[facility] lives with:25569::lives with their family} Lives in: {Lives in:25570} Stairs: {opstairs:27293} Has following equipment at home: {Assistive devices:23999}  OCCUPATION: ***  PLOF: {PLOF:24004}  PATIENT GOALS: ***   OBJECTIVE:  Note: Objective measures were completed at Evaluation unless otherwise noted.  DIAGNOSTIC FINDINGS:  EXAM: 4 VIEW(S) XRAY OF THE LUMBAR SPINE 03/10/2024 11:22:56 AM IMPRESSION: 1. No acute fracture. 2. 4 mm retrolisthesis L5-S1 with associated mild degenerative disc disease.  PATIENT SURVEYS:  Modified Oswestry:  MODIFIED OSWESTRY DISABILITY SCALE  Date: *** Score  Pain intensity {ODI 1:32962}  2. Personal care (washing, dressing, etc.) {ODI 2:32963}  3. Lifting {ODI 3:32964}  4. Walking {ODI 4:32965}  5. Sitting {ODI 5:32966}  6. Standing {ODI 6:32967}  7. Sleeping {ODI 7:32968}  8. Social Life {ODI 8:32969}  9. Traveling {ODI 9:32970}  10. Employment/ Homemaking {ODI 10:32971}  Total ***/50   Interpretation of scores: Score Category Description  0-20% Minimal Disability The patient can cope with most living activities. Usually no treatment is indicated apart from advice on lifting, sitting and exercise  21-40% Moderate Disability The patient experiences more pain and difficulty with sitting, lifting and standing. Travel and social life are more difficult and they may be disabled from work.  Personal care, sexual activity and sleeping are not grossly affected, and the patient can usually be managed by conservative means  41-60% Severe Disability Pain remains the main problem in this group, but activities of daily living are affected. These patients require a detailed investigation  61-80% Crippled Back pain impinges on all aspects of the patient's life. Positive intervention is required  81-100% Bed-bound These patients are either bed-bound or exaggerating their symptoms  Bluford FORBES Zoe DELENA Karon DELENA, et al. Surgery versus conservative management of stable thoracolumbar fracture: the PRESTO feasibility RCT. [place] ([place]): [employer]; 2021 Nov. [facility] Technology Assessment, No. 25.62.) Appendix 3, Oswestry Disability Index category descriptors. Available from: Findjewelers.cz  Minimally Clinically Important Difference (MCID) = 12.8%  COGNITION: Overall cognitive status: Within functional limits for tasks assessed     SENSATION: {sensation:27233}  MUSCLE LENGTH: Hamstrings: Right *** deg; Left *** deg Debby test: Right *** deg; Left *** deg  POSTURE: {posture:25561}  PALPATION: ***  LUMBAR ROM:   AROM eval  Flexion   Extension   Right lateral flexion   Left lateral flexion   Right rotation   Left rotation    (Blank rows = not tested)  LOWER EXTREMITY ROM:     {AROM/PROM:27142}  Right eval Left eval  Hip flexion    Hip extension    Hip abduction    Hip adduction    Hip internal rotation    Hip external rotation    Knee flexion    Knee extension    Ankle dorsiflexion    Ankle plantarflexion    Ankle inversion    Ankle eversion     (Blank rows = not tested)  LOWER EXTREMITY MMT:    MMT Right eval Left eval  Hip flexion    Hip extension    Hip abduction    Hip adduction    Hip internal rotation    Hip external rotation    Knee flexion    Knee extension    Ankle dorsiflexion    Ankle  plantarflexion    Ankle inversion    Ankle eversion     (Blank rows = not tested)  LUMBAR SPECIAL TESTS:  Straight leg raise test: {pos/neg:25243} and FABER test: {pos/neg:25243}  FUNCTIONAL TESTS:  5 times sit to stand: *** 6 minute walk test: ***  GAIT: Distance walked: *** Assistive device utilized: {Assistive devices:23999} Level of assistance: {  Levels of assistance:24026} Comments: ***  TREATMENT DATE: 05/07/24                                                                                                                                 PATIENT EDUCATION:  Education details: HEP, POC, Exercise Technique  Person educated: Patient Education method: Explanation, Demonstration, and Handouts Education comprehension: verbalized understanding and returned demonstration  HOME EXERCISE PROGRAM: ***  ASSESSMENT:  CLINICAL IMPRESSION: Patient is a 48 y.o. male who was seen today for physical therapy evaluation and treatment for ***.   OBJECTIVE IMPAIRMENTS: {opptimpairments:25111}.   ACTIVITY LIMITATIONS: {activitylimitations:27494}  PARTICIPATION LIMITATIONS: {participationrestrictions:25113}  PERSONAL FACTORS: {Personal factors:25162} are also affecting patient's functional outcome.   REHAB POTENTIAL: Good  CLINICAL DECISION MAKING: Stable/uncomplicated  EVALUATION COMPLEXITY: Low   GOALS: Goals reviewed with patient? Yes  SHORT TERM GOALS: Target date: 04/30/2024  Patient will be independent in HEP to improve strength/mobility for better functional independence with ADLs. Baseline: 04/02/24: HEP initiated  Goal status: INITIAL   LONG TERM GOALS: Target date: 05/28/2024  Patient will reduce modified Oswestry score to <20 as to demonstrate minimal disability with ADLs including improved sleeping tolerance, walking/sitting tolerance etc for better mobility with ADLs.  Baseline: 04/02/24: ***  Goal status: INITIAL  2.  Patient will report a worst pain of  3/10 on NRPS in low back to improve tolerance with ADLs and reduced symptoms with activities.  Baseline: 04/02/24: ***  Goal status: INITIAL  3.  Patient (< 83 years old) will complete five times sit to stand test in < 10 seconds indicating an increased LE strength and improved balance. Baseline: 04/02/24: ***  Goal status: INITIAL  4.  Patient will demonstrate proper lifting technique of lifting 30-40 pound box 10 times to improve ability to perform work and home duties.  Baseline:  Goal status: INITIAL  5.  *** Baseline:  Goal status: INITIAL  6.  *** Baseline:  Goal status: INITIAL  PLAN:  PT FREQUENCY: 1-2x/week  PT DURATION: 8 weeks  PLANNED INTERVENTIONS: 97164- PT Re-evaluation, 97750- Physical Performance Testing, 97110-Therapeutic exercises, 97530- Therapeutic activity, [id number]- Neuromuscular re-education, 97535- Self Care, [id number]- Manual therapy, [id number]- Gait training, [phone]- Electrical stimulation (unattended), [id number]- Electrical stimulation (manual), 20560 (1-2 muscles), 20561 (3+ muscles)- Dry Needling, Patient/Family education, Stair training, Spinal manipulation, Spinal mobilization, Cryotherapy, and Moist heat.  PLAN FOR NEXT SESSION: ***   Lonni Pall PT, DPT Physical Therapist- [place]  [facility] 05/07/2024, 10:18 AM

## 2024-05-11 ENCOUNTER — Ambulatory Visit

## 2024-05-11 ENCOUNTER — Ambulatory Visit: Attending: Family Medicine

## 2024-05-13 ENCOUNTER — Ambulatory Visit

## 2024-05-18 ENCOUNTER — Ambulatory Visit

## 2024-05-20 ENCOUNTER — Ambulatory Visit

## 2024-05-27 ENCOUNTER — Ambulatory Visit

## 2024-06-02 ENCOUNTER — Ambulatory Visit

## 2024-06-04 ENCOUNTER — Ambulatory Visit

## 2024-06-09 ENCOUNTER — Ambulatory Visit

## 2024-06-16 ENCOUNTER — Ambulatory Visit

## 2024-06-22 ENCOUNTER — Ambulatory Visit
